# Patient Record
Sex: Female | Born: 1975 | Race: White | Hispanic: No | Marital: Married | Smoking: Former smoker
Health system: Southern US, Community
[De-identification: ages and names within clinical notes are randomized; demographics above are authoritative.]

## PROBLEM LIST (undated history)

## (undated) DIAGNOSIS — C801 Malignant (primary) neoplasm, unspecified: Secondary | ICD-10-CM

## (undated) DIAGNOSIS — K623 Rectal prolapse: Secondary | ICD-10-CM

## (undated) DIAGNOSIS — R519 Headache, unspecified: Secondary | ICD-10-CM

## (undated) DIAGNOSIS — Z9889 Other specified postprocedural states: Secondary | ICD-10-CM

## (undated) DIAGNOSIS — K5900 Constipation, unspecified: Secondary | ICD-10-CM

## (undated) DIAGNOSIS — J189 Pneumonia, unspecified organism: Secondary | ICD-10-CM

## (undated) DIAGNOSIS — R112 Nausea with vomiting, unspecified: Secondary | ICD-10-CM

## (undated) DIAGNOSIS — T7840XA Allergy, unspecified, initial encounter: Secondary | ICD-10-CM

## (undated) DIAGNOSIS — G709 Myoneural disorder, unspecified: Secondary | ICD-10-CM

## (undated) DIAGNOSIS — J302 Other seasonal allergic rhinitis: Secondary | ICD-10-CM

## (undated) DIAGNOSIS — R51 Headache: Secondary | ICD-10-CM

## (undated) HISTORY — PX: SIGMOIDOSCOPY: SUR1295

## (undated) HISTORY — DX: Malignant (primary) neoplasm, unspecified: C80.1

## (undated) HISTORY — DX: Other specified postprocedural states: R11.2

## (undated) HISTORY — DX: Myoneural disorder, unspecified: G70.9

## (undated) HISTORY — PX: POLYPECTOMY: SHX149

## (undated) HISTORY — DX: Other specified postprocedural states: Z98.890

## (undated) HISTORY — DX: Rectal prolapse: K62.3

## (undated) HISTORY — PX: COLONOSCOPY: SHX174

## (undated) HISTORY — DX: Allergy, unspecified, initial encounter: T78.40XA

## (undated) HISTORY — DX: Constipation, unspecified: K59.00

---

## 2012-06-23 DIAGNOSIS — J309 Allergic rhinitis, unspecified: Secondary | ICD-10-CM | POA: Diagnosis present

## 2012-06-23 DIAGNOSIS — K589 Irritable bowel syndrome without diarrhea: Secondary | ICD-10-CM | POA: Insufficient documentation

## 2013-06-02 DIAGNOSIS — K623 Rectal prolapse: Secondary | ICD-10-CM

## 2013-06-02 HISTORY — DX: Rectal prolapse: K62.3

## 2013-09-21 ENCOUNTER — Ambulatory Visit (INDEPENDENT_AMBULATORY_CARE_PROVIDER_SITE_OTHER): Payer: Self-pay | Admitting: Surgery

## 2013-10-07 ENCOUNTER — Ambulatory Visit (INDEPENDENT_AMBULATORY_CARE_PROVIDER_SITE_OTHER): Payer: Self-pay | Admitting: Surgery

## 2016-02-27 ENCOUNTER — Other Ambulatory Visit: Payer: Self-pay | Admitting: Surgery

## 2016-02-27 NOTE — H&P (Signed)
Susan Meyers 02/27/2016 1:41 PM Location: [facility] Patient #: [id number] DOB: 14-Sep-1976 Widowed / Language: Susan Meyers / Race: Undefined Female  History of Present Illness [name]; 02/27/2016 2:41 PM) The patient is a 40 year old female who presents with rectal prolapse. Note for "Rectal prolapse": Pleasant woman originally from [name]. Relocated to [name] 2012. Noted since her last pregnancy worsening prolapse of tissue out her rectum. Has progressively worsened. She notes several inches can fallout when she moves her bowels. She has to push it back in. No history of hemorrhoid problems. She already has a bowel movement every day. No bad history of constipation or diarrhea in the past. Her maternal grandmother did have colon cancer diagnosed in her 38. Therefore, the patient had colonoscopy a few years ago. She recalls a polyp removed that was not precancerous (? Hyperplastic). That was in 2012. No incontinence to flatus or stool. Often has drainage and mucus now. She is evaluated 2014. [place] in rectal prolapse my colorectal surgeon in [place]. Surgery offered. She is in between jobs held off. However things have progressed. She is in a better situations in which to deal with now.  Therefore her primary care physician, Dr. [name], requested surgical consultation.  No personal nor family history of inflammatory bowel disease, allergy such as Celiac Sprue, dietary/dairy problems, colitis, ulcers nor gastritis. No recent sick contacts/gastroenteritis. No travel outside the country. No changes in diet. No dysphagia to solids or liquids. No significant heartburn or reflux. No hematochezia, hematemesis, coffee ground emesis. No evidence of prior gastric/peptic ulceration. She recalls being told she had irritable bowel syndrome but denies any episodes of intermittent constipation nor diarrhea.   Other Problems [name], CMA;  02/27/2016 1:42 PM) Hemorrhoids  Past Surgical History [name], CMA; 02/27/2016 1:42 PM) Colon Polyp Removal - Open  Diagnostic Studies History [name], CMA; 02/27/2016 1:42 PM) Colonoscopy never Mammogram within last year  Allergies [name], CMA; 02/27/2016 1:43 PM) No Known Drug Allergies 02/27/2016  Medication History ([name], CMA; 02/27/2016 1:43 PM) ValACYclovir HCl (500MG  Tablet, Oral as needed) Active. Medications Reconciled  Pregnancy / Birth History [name], CMA; 02/27/2016 1:42 PM) Age at menarche 14 years. Contraceptive History Intrauterine device. Gravida 4 Irregular periods Maternal age 16-20 Para 2     Review of Systems ([place]; 02/27/2016 1:42 PM) General Present- Fatigue. Not Present- Appetite Loss, Chills, Fever, Night Sweats, Weight Gain and Weight Loss. HEENT Present- Sinus Pain and Wears glasses/contact lenses. Not Present- Earache, Hearing Loss, Hoarseness, Nose Bleed, Oral Ulcers, Ringing in the Ears, Seasonal Allergies, Sore Throat, Visual Disturbances and Yellow Eyes. Gastrointestinal Present- Bloody Stool. Not Present- Abdominal Pain, Bloating, Change in Bowel Habits, Chronic diarrhea, Constipation, Difficulty Swallowing, Excessive gas, Gets full quickly at meals, Hemorrhoids, Indigestion, Nausea, Rectal Pain and Vomiting. Neurological Present- Headaches. Not Present- Decreased Memory, Fainting, Numbness, Seizures, Tingling, Tremor, Trouble walking and Weakness. Psychiatric Not Present- Anxiety, Bipolar, Change in Sleep Pattern, Depression, Fearful and Frequent crying. Endocrine Not Present- Cold Intolerance, Excessive Hunger, Hair Changes, Heat Intolerance, Hot flashes and New Diabetes. Hematology Not Present- Easy Bruising, Excessive bleeding, Gland problems, HIV and Persistent Infections.  Vitals ([name] CMA; 02/27/2016 1:42 PM) 02/27/2016 1:42 PM Weight: 135 lb Height: 64in Body Surface Area: 1.66 m Body  Mass Index: 23.17 kg/m  Temp.: 98.23F(Temporal)  Pulse: 81 (Regular)  BP: 124/76 (Sitting, Left Arm, Standard)      Physical Exam [name]; 02/27/2016 2:42 PM)  General Mental Status-Alert. General Appearance-Not in acute distress, Not Sickly. Orientation-Oriented X3. Hydration-Well hydrated. Voice-Normal. Note: Pleasant, chatty  Integumentary Global Assessment Upon inspection and palpation of skin surfaces of the - Axillae: non-tender, no inflammation or ulceration, no drainage. and Distribution of scalp and body hair is normal. General Characteristics Temperature - normal warmth is noted.  Head and Neck Head-normocephalic, atraumatic with no lesions or palpable masses. Face Global Assessment - atraumatic, no absence of expression. Neck Global Assessment - no abnormal movements, no bruit auscultated on the right, no bruit auscultated on the left, no decreased range of motion, non-tender. Trachea-midline. Thyroid Gland Characteristics - non-tender.  Eye Eyeball - Left-Extraocular movements intact, No Nystagmus. Eyeball - Right-Extraocular movements intact, No Nystagmus. Cornea - Left-No Hazy. Cornea - Right-No Hazy. Sclera/Conjunctiva - Left-No scleral icterus, No Discharge. Sclera/Conjunctiva - Right-No scleral icterus, No Discharge. Pupil - Left-Direct reaction to light normal. Pupil - Right-Direct reaction to light normal.  ENMT Ears Pinna - Left - no drainage observed, no generalized tenderness observed. Right - no drainage observed, no generalized tenderness observed. Nose and Sinuses External Inspection of the Nose - no destructive lesion observed. Inspection of the nares - Left - quiet respiration. Right - quiet respiration. Mouth and Throat Lips - Upper Lip - no fissures observed, no pallor noted. Lower Lip - no fissures observed, no pallor noted. Nasopharynx - no discharge present. Oral Cavity/Oropharynx -  Tongue - no dryness observed. Oral Mucosa - no cyanosis observed. Hypopharynx - no evidence of airway distress observed.  Chest and Lung Exam Inspection Movements - Normal and Symmetrical. Accessory muscles - No use of accessory muscles in breathing. Palpation Palpation of the chest reveals - Non-tender. Auscultation Breath sounds - Normal and Clear.  Cardiovascular Auscultation Rhythm - Regular. Murmurs & Other Heart Sounds - Auscultation of the heart reveals - No Murmurs and No Systolic Clicks.  Abdomen Inspection Inspection of the abdomen reveals - No Visible peristalsis and No Abnormal pulsations. Umbilicus - No Bleeding, No Urine drainage. Palpation/Percussion Palpation and Percussion of the abdomen reveal - Soft, Non Tender, No Rebound tenderness, No Rigidity (guarding) and No Cutaneous hyperesthesia.  Female Genitourinary Sexual Maturity Tanner 5 - Adult hair pattern. Note: Labia normal. No Bartholin's cyst inflammation. No rectocele. No cystocele. No vaginal bleeding nor discharge  Rectal Note: Examination done was female assistant present. Perianal skin clear. Mildly enlarged midline raphae tissue. Slightly reduced but intact sphincter tone. Redundant rectum on palpation. Not consistent with hemorrhoids. Circumferential , especially posteriorly. Irritation consistent with prolapse. No rectocele. No cystocele. On anoscopy moderate volume of rectal tissue easily prolapses out. Not full proximal a dentia. From another colorectal surgeon the year before was able to reproduce prolapse with her sitting on the toilet & straining for 10 minutes. Given that, did not feel needed to repeat.  Peripheral Vascular Upper Extremity Inspection - Left - No Cyanotic nailbeds, Not Ischemic. Right - No Cyanotic nailbeds, Not Ischemic.  Neurologic Neurologic evaluation reveals -normal attention span and ability to concentrate, able to name objects and repeat phrases. Appropriate  fund of knowledge , normal sensation and normal coordination. Mental Status Affect - not angry, not paranoid. Cranial Nerves-Normal Bilaterally. Gait-Normal.  Neuropsychiatric Mental status exam performed with findings of-able to articulate well with normal speech/language, rate, volume and coherence, thought content normal with ability to perform basic computations and apply abstract reasoning and no evidence of hallucinations, delusions, obsessions or homicidal/suicidal ideation.  Musculoskeletal Global Assessment Spine, Ribs and Pelvis - no instability, subluxation or  laxity. Right Upper Extremity - no instability, subluxation or laxity.  Lymphatic Head & Neck  General Head & Neck Lymphatics: Bilateral - Description - No Localized lymphadenopathy. Axillary  General Axillary Region: Bilateral - Description - No Localized lymphadenopathy. Femoral & Inguinal  Generalized Femoral & Inguinal Lymphatics: Left - Description - No Localized lymphadenopathy. Right - Description - No Localized lymphadenopathy.    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 Not consistent with simply hemorrhoid.  This will require more definitive surgery to treat. Given her young age, would do resection rectopexy. Good candidate for a robotic/laparoscopic approach. She is anxious to proceed.  I did discuss with her the need for follow-up colonoscopy given her history of rectal polyps. However, she feels almost certain that this was a non-precancerous, hyperplastic polyp. Therefore colonoscopy in 10 years is reasonable (2021) she is going to check on the pathology report from his real. If it is adenomatous, I would recommend colonoscopy. If hyperplastic. Can wait. She would like to hold off.  Current Plans Pt Education - [facility] Pelvic Floor  Exercises (Kegels) and Dysfunction HCI (Tiauna Whisnant) ENCOUNTER FOR PREOPERATIVE EXAMINATION FOR GENERAL SURGICAL PROCEDURE (Z01.818)  Current Plans You are being scheduled for surgery - Our schedulers will call you.  You should hear from our office's scheduling department within 5 working days about the location, date, and time of surgery. We try to make accommodations for patient's preferences in scheduling surgery, but sometimes the OR schedule or the surgeon's schedule prevents [place] from making those accommodations.  If you have not heard from our office [phone]) in 5 working days, call the office and ask for your surgeon's nurse.  If you have other questions about your diagnosis, plan, or surgery, call the office and ask for your surgeon's nurse.  Written instructions provided Pt Education - [facility] Colon Bowel Prep 2015 Miralax/Antibiotics Started Neomycin Sulfate 500MG , 2 (two) Tablet SEE NOTE, #6, 02/27/2016, No Refill. Local Order: TAKE TWO TABLETS AT 2 PM, 3 PM, AND 10 PM THE DAY PRIOR TO SURGERY Started Flagyl 500MG , 2 (two) Tablet SEE NOTE, #6, 02/27/2016, No Refill. Local Order: Take at 2pm, 3pm, and 10pm the day prior to your colon operation Pt Education - [facility] Colectomy post-op instructions: discussed with patient and provided information. Pt Education - Pamphlet Given - Laparoscopic Colorectal Surgery: discussed with patient and provided information. Pt Education - [facility] Good Bowel Health (Eliah Ozawa) Pt Education - [facility] Pain Control (Vasilios Ottaway)  Adin Hector, M.D., F.A.C.S. Gastrointestinal and Minimally Invasive Surgery [facility] [address], [place], [place] [address] [phone] Main / Paging

## 2016-05-29 ENCOUNTER — Other Ambulatory Visit: Payer: Self-pay | Admitting: Surgery

## 2016-05-29 NOTE — Patient Instructions (Addendum)
Jacquelynne A Chong  05/29/2016   Your procedure is scheduled on: 06/06/2016    Report to [facility]  Entrance take [facility]  elevators to 3rd floor to  [place] at  1000 AM.  Call this number if you have problems the morning of surgery [phone]   Remember: ONLY 1 PERSON MAY GO WITH YOU TO SHORT STAY TO GET  READY MORNING OF [place].  Do not eat food or drink liquids :After Midnight.             Follow Bowel Prep Instructions per MD      Take these medicines the morning of surgery with A SIP OF WATER: Zyrtec, Ventolin Inhaler if needed and bring                                You may not have any metal on your body including hair pins and              piercings  Do not wear jewelry, make-up, lotions, powders or perfumes, deodorant             Do not wear nail polish.  Do not shave  48 hours prior to surgery.                Do not bring valuables to the hospital. [place].  Contacts, dentures or bridgework may not be worn into surgery.  Leave suitcase in the car. After surgery it may be brought to your room.     CLEAR LIQUID DIET   Foods Allowed                                                                     Foods Excluded  Coffee and tea, regular and decaf                             liquids that you cannot  Plain Jell-O in any flavor                                             see through such as: Fruit ices (not with fruit pulp)                                     milk, soups, orange juice  Iced Popsicles                                    All solid food Carbonated beverages, regular and diet  Cranberry, grape and apple juices Sports drinks like Gatorade Lightly seasoned clear broth or consume(fat free) Sugar, honey syrup  Sample Menu Breakfast                                Lunch                                     Supper Cranberry juice                     Beef broth                            Chicken broth Jell-O                                     Grape juice                           Apple juice Coffee or tea                        Jell-O                                      Popsicle                                                Coffee or tea                        Coffee or tea  _____________________________________________________________________                  Please read over the following fact sheets you were given: _____________________________________________________________________             [facility] - Preparing for Surgery Before surgery, you can play an important role.  Because skin is not sterile, your skin needs to be as free of germs as possible.  You can reduce the number of germs on your skin by washing with CHG (chlorahexidine gluconate) soap before surgery.  CHG is an antiseptic cleaner which kills germs and bonds with the skin to continue killing germs even after washing. Please DO NOT use if you have an allergy to CHG or antibacterial soaps.  If your skin becomes reddened/irritated stop using the CHG and inform your nurse when you arrive at Short Stay. Do not shave (including legs and underarms) for at least 48 hours prior to the first CHG shower.  You may shave your face/neck. Please follow these instructions carefully:  1.  Shower with CHG Soap the night before surgery and the  morning of Surgery.  2.  If you choose to wash your hair, wash your hair first as usual with your  normal  shampoo.  3.  After you shampoo, rinse your hair and body thoroughly to remove the  shampoo.  4.  Use CHG as you would any other liquid soap.  You can apply chg directly  to the skin and wash                       Gently with a scrungie or clean washcloth.  5.  Apply the CHG Soap to your body ONLY FROM THE NECK DOWN.   Do not use on face/ open                           Wound or open sores. Avoid  contact with eyes, ears mouth and genitals (private parts).                       Wash face,  Genitals (private parts) with your normal soap.             6.  Wash thoroughly, paying special attention to the area where your surgery  will be performed.  7.  Thoroughly rinse your body with warm water from the neck down.  8.  DO NOT shower/wash with your normal soap after using and rinsing off  the CHG Soap.                9.  Pat yourself dry with a clean towel.            10.  Wear clean pajamas.            11.  Place clean sheets on your bed the night of your first shower and do not  sleep with pets. Day of Surgery : Do not apply any lotions/deodorants the morning of surgery.  Please wear clean clothes to the hospital/surgery center.  FAILURE TO FOLLOW THESE INSTRUCTIONS MAY RESULT IN THE CANCELLATION OF YOUR SURGERY PATIENT SIGNATURE_________________________________  NURSE SIGNATURE__________________________________  ________________________________________________________________________  WHAT IS A BLOOD TRANSFUSION? Blood Transfusion Information  A transfusion is the replacement of blood or some of its parts. Blood is made up of multiple cells which provide different functions.  Red blood cells carry oxygen and are used for blood loss replacement.  White blood cells fight against infection.  Platelets control bleeding.  Plasma helps clot blood.  Other blood products are available for specialized needs, such as hemophilia or other clotting disorders. BEFORE THE TRANSFUSION  Who gives blood for transfusions?   Healthy volunteers who are fully evaluated to make sure their blood is safe. This is blood bank blood. Transfusion therapy is the safest it has ever been in the practice of medicine. Before blood is taken from a donor, a complete history is taken to make sure that person has no history of diseases nor engages in risky social behavior (examples are intravenous drug use or  sexual activity with multiple partners). The donor's travel history is screened to minimize risk of transmitting infections, such as malaria. The donated blood is tested for signs of infectious diseases, such as HIV and hepatitis. The blood is then tested to be sure it is compatible with you in order to minimize the chance of a transfusion reaction. If you or a relative donates blood, this is often done in anticipation of surgery and is not appropriate for emergency situations. It takes many days to process the donated blood. RISKS AND COMPLICATIONS Although transfusion therapy is very safe and saves many lives, the main dangers of transfusion include:   Getting an infectious disease.  Developing a transfusion reaction.  This is an allergic reaction to something in the blood you were given. Every precaution is taken to prevent this. The decision to have a blood transfusion has been considered carefully by your caregiver before blood is given. Blood is not given unless the benefits outweigh the risks. AFTER THE TRANSFUSION  Right after receiving a blood transfusion, you will usually feel much better and more energetic. This is especially true if your red blood cells have gotten low (anemic). The transfusion raises the level of the red blood cells which carry oxygen, and this usually causes an energy increase.  The nurse administering the transfusion will monitor you carefully for complications. HOME CARE INSTRUCTIONS  No special instructions are needed after a transfusion. You may find your energy is better. Speak with your caregiver about any limitations on activity for underlying diseases you may have. SEEK MEDICAL CARE IF:   Your condition is not improving after your transfusion.  You develop redness or irritation at the intravenous (IV) site. SEEK IMMEDIATE MEDICAL CARE IF:  Any of the following symptoms occur over the next 12 hours:  Shaking chills.  You have a temperature by mouth above  102 F (38.9 C), not controlled by medicine.  Chest, back, or muscle pain.  People around you feel you are not acting correctly or are confused.  Shortness of breath or difficulty breathing.  Dizziness and fainting.  You get a rash or develop hives.  You have a decrease in urine output.  Your urine turns a dark color or changes to pink, red, or brown. Any of the following symptoms occur over the next 10 days:  You have a temperature by mouth above 102 F (38.9 C), not controlled by medicine.  Shortness of breath.  Weakness after normal activity.  The white part of the eye turns yellow (jaundice).  You have a decrease in the amount of urine or are urinating less often.  Your urine turns a dark color or changes to pink, red, or brown. Document Released: 12/07/2000 Document Revised: 03/03/2012 Document Reviewed: 07/26/2008 [facility] Patient Information 2014 [place], [place].  _______________________________________________________________________

## 2016-05-29 NOTE — H&P (Signed)
Susan Meyers 02/27/2016 1:41 PM Location: [facility] Patient #: [id number] DOB: Sep 20, 1976 Widowed / Language: Susan Meyers / Race: Undefined Female  Patient Care Team: Susan Arnt, MD as PCP - General (Family Medicine) Susan Boston, MD as Consulting Physician (General Surgery)   History of Present Illness  The patient is a 40 year old female who presents with rectal prolapse.   Pleasant woman originally from [name]. Relocated to [name] 2012. Noted since her last pregnancy worsening prolapse of tissue out her rectum. Has progressively worsened. She notes several inches can fallout when she moves her bowels. She has to push it back in. No history of hemorrhoid problems. She already has a bowel movement every day. No bad history of constipation or diarrhea in the past. Her maternal grandmother did have colon cancer diagnosed in her 2. Therefore, the patient had colonoscopy a few years ago. She recalls a polyp removed that was not precancerous (? Hyperplastic). That was in 2012. No incontinence to flatus or stool. Often has drainage and mucus now. She is evaluated 2014. Susan Meyers in rectal prolapse my colorectal surgeon in [place]. Surgery offered. She is in between jobs held off. However things have progressed. She is in a better situations in which to deal with now.  Therefore her primary care physician, Susan Meyers, requested surgical consultation.  No personal nor family history of inflammatory bowel disease, allergy such as Celiac Sprue, dietary/dairy problems, colitis, ulcers nor gastritis. No recent sick contacts/gastroenteritis. No travel outside the country. No changes in diet. No dysphagia to solids or liquids. No significant heartburn or reflux. No hematochezia, hematemesis, coffee ground emesis. No evidence of prior gastric/peptic ulceration. She recalls being told she had irritable bowel syndrome but denies any episodes of  intermittent constipation nor diarrhea.   Other Problems Susan Meyers, CMA; 02/27/2016 1:42 PM) Hemorrhoids  Past Surgical History Susan Meyers, CMA; 02/27/2016 1:42 PM) Colon Polyp Removal - Open  Diagnostic Studies History Susan Meyers, CMA; 02/27/2016 1:42 PM) Colonoscopy never Mammogram within last year  Allergies Susan Meyers, CMA; 02/27/2016 1:43 PM) No Known Drug Allergies03/05/2016  Medication History (Susan Meyers, CMA; 02/27/2016 1:43 PM) ValACYclovir HCl (500MG  Tablet, Oral as needed) Active. Medications Reconciled  Pregnancy / Birth History Susan Meyers, CMA; 02/27/2016 1:42 PM) Age at menarche 34 years. Contraceptive History Intrauterine device. Gravida 4 Irregular periods Maternal age 23-20 Para 2    Review of Systems ([name]; 02/27/2016 1:42 PM) General Present- Fatigue. Not Present- Appetite Loss, Chills, Fever, Night Sweats, Weight Gain and Weight Loss. HEENT Present- Sinus Pain and Wears glasses/contact lenses. Not Present- Earache, Hearing Loss, Hoarseness, Nose Bleed, Oral Ulcers, Ringing in the Ears, Seasonal Allergies, Sore Throat, Visual Disturbances and Yellow Eyes. Gastrointestinal Present- Bloody Stool. Not Present- Abdominal Pain, Bloating, Change in Bowel Habits, Chronic diarrhea, Constipation, Difficulty Swallowing, Excessive gas, Gets full quickly at meals, Hemorrhoids, Indigestion, Nausea, Rectal Pain and Vomiting. Neurological Present- Headaches. Not Present- Decreased Memory, Fainting, Numbness, Seizures, Tingling, Tremor, Trouble walking and Weakness. Psychiatric Not Present- Anxiety, Bipolar, Change in Sleep Pattern, Depression, Fearful and Frequent crying. Endocrine Not Present- Cold Intolerance, Excessive Hunger, Hair Changes, Heat Intolerance, Hot flashes and New Diabetes. Hematology Not Present- Easy Bruising, Excessive bleeding, Gland problems, HIV and Persistent Infections.  Vitals (Susan Meyers CMA; 02/27/2016 1:42 PM) 02/27/2016  1:42 PM Weight: 135 lb Height: 64in Body Surface Area: 1.66 m Body Mass Index: 23.17 kg/m  Temp.: 98.50F(Temporal)  Pulse: 81 (Regular)  BP: 124/76 (Sitting, Left Arm, Standard)  Physical Exam Susan Hector MD; 02/27/2016 2:42 PM) General Mental Status-Alert. General Appearance-Not in acute distress, Not Sickly. Orientation-Oriented X3. Hydration-Well hydrated. Voice-Normal. Note: Pleasant, chatty   Integumentary Global Assessment Upon inspection and palpation of skin surfaces of the - Axillae: non-tender, no inflammation or ulceration, no drainage. and Distribution of scalp and body hair is normal. General Characteristics Temperature - normal warmth is noted.  Head and Neck Head-normocephalic, atraumatic with no lesions or palpable masses. Face Global Assessment - atraumatic, no absence of expression. Neck Global Assessment - no abnormal movements, no bruit auscultated on the right, no bruit auscultated on the left, no decreased range of motion, non-tender. Trachea-midline. Thyroid Gland Characteristics - non-tender.  Eye Eyeball - Left-Extraocular movements intact, No Nystagmus. Eyeball - Right-Extraocular movements intact, No Nystagmus. Cornea - Left-No Hazy. Cornea - Right-No Hazy. Sclera/Conjunctiva - Left-No scleral icterus, No Discharge. Sclera/Conjunctiva - Right-No scleral icterus, No Discharge. Pupil - Left-Direct reaction to light normal. Pupil - Right-Direct reaction to light normal.  ENMT Ears Pinna - Left - no drainage observed, no generalized tenderness observed. Right - no drainage observed, no generalized tenderness observed. Nose and Sinuses External Inspection of the Nose - no destructive lesion observed. Inspection of the nares - Left - quiet respiration. Right - quiet respiration. Mouth and Throat Lips - Upper Lip - no fissures observed, no pallor noted. Lower Lip - no fissures observed, no  pallor noted. Nasopharynx - no discharge present. Oral Cavity/Oropharynx - Tongue - no dryness observed. Oral Mucosa - no cyanosis observed. Hypopharynx - no evidence of airway distress observed.  Chest and Lung Exam Inspection Movements - Normal and Symmetrical. Accessory muscles - No use of accessory muscles in breathing. Palpation Palpation of the chest reveals - Non-tender. Auscultation Breath sounds - Normal and Clear.  Cardiovascular Auscultation Rhythm - Regular. Murmurs & Other Heart Sounds - Auscultation of the heart reveals - No Murmurs and No Systolic Clicks.  Abdomen Inspection Inspection of the abdomen reveals - No Visible peristalsis and No Abnormal pulsations. Umbilicus - No Bleeding, No Urine drainage. Palpation/Percussion Palpation and Percussion of the abdomen reveal - Soft, Non Tender, No Rebound tenderness, No Rigidity (guarding) and No Cutaneous hyperesthesia.  Female Genitourinary Sexual Maturity Tanner 5 - Adult hair pattern. Note: Labia normal. No Bartholin's cyst inflammation. No rectocele. No cystocele. No vaginal bleeding nor discharge   Rectal Note: Examination done was female assistant present. Perianal skin clear. Mildly enlarged midline raphae tissue. Slightly reduced but intact sphincter tone. Redundant rectum on palpation. Not consistent with hemorrhoids. Circumferential , especially posteriorly. Irritation consistent with prolapse. No rectocele. No cystocele. On anoscopy moderate volume of rectal tissue easily prolapses out. From another colorectal surgeon the year before was able to reproduce prolapse with her sitting on the toilet & straining for 10 minutes. Given that, did not feel needed to repeat.   Peripheral Vascular Upper Extremity Inspection - Left - No Cyanotic nailbeds, Not Ischemic. Right - No Cyanotic nailbeds, Not Ischemic.  Neurologic Neurologic evaluation reveals -normal attention span and ability to concentrate, able  to name objects and repeat phrases. Appropriate fund of knowledge , normal sensation and normal coordination. Mental Status Affect - not angry, not paranoid. Cranial Nerves-Normal Bilaterally. Gait-Normal.  Neuropsychiatric Mental status exam performed with findings of-able to articulate well with normal speech/language, rate, volume and coherence, thought content normal with ability to perform basic computations and apply abstract reasoning and no evidence of hallucinations, delusions, obsessions or homicidal/suicidal ideation.  Musculoskeletal Global Assessment Spine, Ribs  and Pelvis - no instability, subluxation or laxity. Right Upper Extremity - no instability, subluxation or laxity.  Lymphatic Head & Neck  General Head & Neck Lymphatics: Bilateral - Description - No Localized lymphadenopathy. Axillary  General Axillary Region: Bilateral - Description - No Localized lymphadenopathy. Femoral & Inguinal  Generalized Femoral & Inguinal Lymphatics: Left - Description - No Localized lymphadenopathy. Right - Description - No Localized lymphadenopathy.    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 Not consistent with simply hemorrhoid.  This will require more definitive surgery to treat. Given her young age, would do resection & rectopexy. Good candidate for a robotic/laparoscopic approach. She is anxious to proceed.  I did discuss with her the need for follow-up colonoscopy given her history of rectal polyps. However, she feels almost certain that this was a non-precancerous, hyperplastic polyp. Therefore colonoscopy in 10 years is reasonable (2021) she is going to check on the pathology report from his real. If it is adenomatous, I would recommend colonoscopy. If hyperplastic. Can wait. She would like to hold off. Current Plans Pt Education - CCS  Pelvic Floor Exercises (Kegels) and Dysfunction HCI ([place])  ENCOUNTER FOR PREOPERATIVE EXAMINATION FOR GENERAL SURGICAL PROCEDURE (Z01.818) Current Plans You are being scheduled for surgery - Our schedulers will call you.  You should hear from our office's scheduling department within 5 working days about the location, date, and time of surgery. We try to make accommodations for patient's preferences in scheduling surgery, but sometimes the OR schedule or the surgeon's schedule prevents [place] from making those accommodations.  If you have not heard from our office [phone]) in 5 working days, call the office and ask for your surgeon's nurse.  If you have other questions about your diagnosis, plan, or surgery, call the office and ask for your surgeon's nurse.  Written instructions provided Pt Education - CCS Colon Bowel Prep 2015 Miralax/Antibiotics Started Neomycin Sulfate 500MG , 2 (two) Tablet SEE NOTE, #6, 02/27/2016, No Refill. Local Order: TAKE TWO TABLETS AT 2 PM, 3 PM, AND 10 PM THE DAY PRIOR TO SURGERY Started Flagyl 500MG , 2 (two) Tablet SEE NOTE, #6, 02/27/2016, No Refill. Local Order: Take at 2pm, 3pm, and 10pm the day prior to your colon operation Pt Education - CCS Colectomy post-op instructions: discussed with patient and provided information. Pt Education - Pamphlet Given - Laparoscopic Colorectal Surgery: discussed with patient and provided information. Pt Education - CCS Good Bowel Health (Cyril [place]) Pt Education - CCS Pain Control (Osamu Olguin)   Signed by Susan Hector, MD   Susan Meyers, M.D., F.A.C.S. Gastrointestinal and Minimally Invasive Surgery [facility] [address], [place], [place] [address] [phone] Main / Paging

## 2016-06-01 ENCOUNTER — Encounter (HOSPITAL_COMMUNITY): Payer: Self-pay

## 2016-06-01 ENCOUNTER — Encounter (HOSPITAL_COMMUNITY)
Admission: RE | Admit: 2016-06-01 | Discharge: 2016-06-01 | Disposition: A | Discharge status: Home / Self Care | Payer: BLUE CROSS/BLUE SHIELD | Source: Ambulatory Visit | Attending: Surgery | Admitting: Surgery

## 2016-06-01 DIAGNOSIS — Z01812 Encounter for preprocedural laboratory examination: Secondary | ICD-10-CM | POA: Insufficient documentation

## 2016-06-01 DIAGNOSIS — K623 Rectal prolapse: Secondary | ICD-10-CM | POA: Insufficient documentation

## 2016-06-01 DIAGNOSIS — Z0183 Encounter for blood typing: Secondary | ICD-10-CM | POA: Diagnosis not present

## 2016-06-01 HISTORY — DX: Pneumonia, unspecified organism: J18.9

## 2016-06-01 HISTORY — DX: Other seasonal allergic rhinitis: J30.2

## 2016-06-01 HISTORY — DX: Headache: R51

## 2016-06-01 HISTORY — DX: Headache, unspecified: R51.9

## 2016-06-01 LAB — CBC
HEMATOCRIT: 39.6 % (ref 36.0–46.0)
Hemoglobin: 13.4 g/dL (ref 12.0–15.0)
MCH: 31.6 pg (ref 26.0–34.0)
MCHC: 33.8 g/dL (ref 30.0–36.0)
MCV: 93.4 fL (ref 78.0–100.0)
PLATELETS: 327 10*3/uL (ref 150–400)
RBC: 4.24 MIL/uL (ref 3.87–5.11)
RDW: 13 % (ref 11.5–15.5)
WBC: 7.6 10*3/uL (ref 4.0–10.5)

## 2016-06-01 LAB — ABO/RH: ABO/RH(D): B NEG

## 2016-06-01 NOTE — Pre-Procedure Instructions (Signed)
Pt received PST instructions.   Pt stated she did not receive bowel prep instructions from [facility].  Spoke with Abigail Butts at [facility] on the phone. She faxed Bowel Prep instructions.  I gave them to pt and she reviewed them in office.

## 2016-06-02 LAB — HEMOGLOBIN A1C
Hgb A1c MFr Bld: 4.9 % (ref 4.8–5.6)
MEAN PLASMA GLUCOSE: 94 mg/dL

## 2016-06-05 MED ORDER — SODIUM CHLORIDE 0.9 % IV SOLN
INTRAVENOUS | Status: DC
  Filled 2016-06-05: qty 6

## 2016-06-06 ENCOUNTER — Inpatient Hospital Stay (HOSPITAL_COMMUNITY): Payer: BLUE CROSS/BLUE SHIELD | Admitting: Certified Registered Nurse Anesthetist

## 2016-06-06 ENCOUNTER — Inpatient Hospital Stay (HOSPITAL_COMMUNITY)
Admission: RE | Admit: 2016-06-06 | Discharge: 2016-06-09 | DRG: 330 | Disposition: A | Discharge status: Home / Self Care | Payer: BLUE CROSS/BLUE SHIELD | Source: Ambulatory Visit | Attending: Surgery | Admitting: Surgery

## 2016-06-06 ENCOUNTER — Encounter (HOSPITAL_COMMUNITY): Payer: Self-pay | Admitting: Certified Registered Nurse Anesthetist

## 2016-06-06 ENCOUNTER — Encounter (HOSPITAL_COMMUNITY)
Admission: RE | Disposition: A | Discharge status: Home / Self Care | Payer: Self-pay | Source: Ambulatory Visit | Attending: Surgery

## 2016-06-06 DIAGNOSIS — Z87891 Personal history of nicotine dependence: Secondary | ICD-10-CM | POA: Diagnosis not present

## 2016-06-06 DIAGNOSIS — Z800 Family history of malignant neoplasm of digestive organs: Secondary | ICD-10-CM | POA: Diagnosis not present

## 2016-06-06 DIAGNOSIS — K623 Rectal prolapse: Secondary | ICD-10-CM | POA: Diagnosis present

## 2016-06-06 DIAGNOSIS — Q438 Other specified congenital malformations of intestine: Secondary | ICD-10-CM

## 2016-06-06 DIAGNOSIS — J309 Allergic rhinitis, unspecified: Secondary | ICD-10-CM | POA: Diagnosis present

## 2016-06-06 HISTORY — PX: PROCTOSCOPY: SHX2266

## 2016-06-06 HISTORY — DX: Rectal prolapse: K62.3

## 2016-06-06 LAB — TYPE AND SCREEN
ABO/RH(D): B NEG
Antibody Screen: NEGATIVE

## 2016-06-06 LAB — PREGNANCY, URINE: PREG TEST UR: NEGATIVE

## 2016-06-06 SURGERY — COLECTOMY, PARTIAL, ROBOT-ASSISTED, LAPAROSCOPIC
Anesthesia: General | Site: Rectum

## 2016-06-06 MED ORDER — BUPIVACAINE-EPINEPHRINE 0.25% -1:200000 IJ SOLN
INTRAMUSCULAR | Status: AC
  Filled 2016-06-06: qty 1

## 2016-06-06 MED ORDER — DEXAMETHASONE SODIUM PHOSPHATE 10 MG/ML IJ SOLN
INTRAMUSCULAR | Status: DC | PRN
  Administered 2016-06-06: 10 mg via INTRAVENOUS

## 2016-06-06 MED ORDER — HYDROMORPHONE HCL 1 MG/ML IJ SOLN
0.5000 mg | INTRAMUSCULAR | Status: DC | PRN
  Administered 2016-06-08: 1 mg via INTRAVENOUS
  Filled 2016-06-06: qty 1

## 2016-06-06 MED ORDER — METOPROLOL TARTRATE 5 MG/5ML IV SOLN: 5.0000 mg | Freq: Four times a day (QID) | INTRAVENOUS | Status: DC | PRN

## 2016-06-06 MED ORDER — SCOPOLAMINE 1 MG/3DAYS TD PT72
MEDICATED_PATCH | TRANSDERMAL | Status: DC | PRN
  Administered 2016-06-06: 1 via TRANSDERMAL

## 2016-06-06 MED ORDER — LACTATED RINGERS IV SOLN
INTRAVENOUS | Status: DC
  Administered 2016-06-06 – 2016-06-07 (×2): via INTRAVENOUS

## 2016-06-06 MED ORDER — LACTATED RINGERS IV SOLN
INTRAVENOUS | Status: DC | PRN
  Administered 2016-06-06 (×3): via INTRAVENOUS

## 2016-06-06 MED ORDER — MENTHOL 3 MG MT LOZG: 1.0000 | LOZENGE | OROMUCOSAL | Status: DC | PRN

## 2016-06-06 MED ORDER — ALVIMOPAN 12 MG PO CAPS
12.0000 mg | ORAL_CAPSULE | Freq: Two times a day (BID) | ORAL | Status: DC
  Administered 2016-06-07 (×2): 12 mg via ORAL
  Filled 2016-06-06 (×4): qty 1

## 2016-06-06 MED ORDER — ONDANSETRON HCL 4 MG/2ML IJ SOLN
INTRAMUSCULAR | Status: AC
  Filled 2016-06-06: qty 2

## 2016-06-06 MED ORDER — FENTANYL CITRATE (PF) 100 MCG/2ML IJ SOLN
INTRAMUSCULAR | Status: DC | PRN
  Administered 2016-06-06 (×4): 50 ug via INTRAVENOUS
  Administered 2016-06-06: 100 ug via INTRAVENOUS
  Administered 2016-06-06: 50 ug via INTRAVENOUS

## 2016-06-06 MED ORDER — MIDAZOLAM HCL 2 MG/2ML IJ SOLN
INTRAMUSCULAR | Status: AC
  Filled 2016-06-06: qty 2

## 2016-06-06 MED ORDER — BUPIVACAINE LIPOSOME 1.3 % IJ SUSP
20.0000 mL | INTRAMUSCULAR | Status: DC
  Filled 2016-06-06: qty 20

## 2016-06-06 MED ORDER — OXYCODONE HCL 5 MG PO TABS: 5.0000 mg | ORAL_TABLET | Freq: Four times a day (QID) | ORAL | Status: DC | PRN

## 2016-06-06 MED ORDER — ROCURONIUM BROMIDE 100 MG/10ML IV SOLN
INTRAVENOUS | Status: DC | PRN
  Administered 2016-06-06: 20 mg via INTRAVENOUS
  Administered 2016-06-06: 50 mg via INTRAVENOUS
  Administered 2016-06-06: 20 mg via INTRAVENOUS
  Administered 2016-06-06 (×3): 10 mg via INTRAVENOUS

## 2016-06-06 MED ORDER — PROPOFOL 10 MG/ML IV BOLUS
INTRAVENOUS | Status: DC | PRN
  Administered 2016-06-06: 120 mg via INTRAVENOUS

## 2016-06-06 MED ORDER — BUPIVACAINE LIPOSOME 1.3 % IJ SUSP
INTRAMUSCULAR | Status: DC | PRN
  Administered 2016-06-06: 20 mL

## 2016-06-06 MED ORDER — VITAMIN C 500 MG PO TABS
500.0000 mg | ORAL_TABLET | Freq: Two times a day (BID) | ORAL | Status: DC
Start: 2016-06-06 — End: 2016-06-09
  Administered 2016-06-06 – 2016-06-08 (×4): 500 mg via ORAL
  Filled 2016-06-06 (×8): qty 1

## 2016-06-06 MED ORDER — DEXAMETHASONE SODIUM PHOSPHATE 10 MG/ML IJ SOLN
INTRAMUSCULAR | Status: AC
  Filled 2016-06-06: qty 1

## 2016-06-06 MED ORDER — SCOPOLAMINE 1 MG/3DAYS TD PT72
MEDICATED_PATCH | TRANSDERMAL | Status: AC
  Filled 2016-06-06: qty 1

## 2016-06-06 MED ORDER — EPHEDRINE SULFATE 50 MG/ML IJ SOLN
INTRAMUSCULAR | Status: DC | PRN
  Administered 2016-06-06: 10 mg via INTRAVENOUS

## 2016-06-06 MED ORDER — VALACYCLOVIR HCL 500 MG PO TABS
500.0000 mg | ORAL_TABLET | Freq: Every day | ORAL | Status: DC | PRN
  Filled 2016-06-06: qty 1

## 2016-06-06 MED ORDER — PROMETHAZINE HCL 25 MG/ML IJ SOLN
6.2500 mg | INTRAMUSCULAR | Status: AC | PRN
  Administered 2016-06-06 (×2): 6.25 mg via INTRAVENOUS

## 2016-06-06 MED ORDER — ROCURONIUM BROMIDE 100 MG/10ML IV SOLN
INTRAVENOUS | Status: AC
  Filled 2016-06-06: qty 1

## 2016-06-06 MED ORDER — SODIUM CHLORIDE 0.9 % IV SOLN
INTRAVENOUS | Status: DC | PRN
  Administered 2016-06-06: 13:00:00 via INTRAPERITONEAL

## 2016-06-06 MED ORDER — CEFOTETAN DISODIUM-DEXTROSE 2-2.08 GM-% IV SOLR
INTRAVENOUS | Status: AC
  Filled 2016-06-06: qty 50

## 2016-06-06 MED ORDER — ENOXAPARIN SODIUM 40 MG/0.4ML ~~LOC~~ SOLN
40.0000 mg | SUBCUTANEOUS | Status: DC
Start: 2016-06-07 — End: 2016-06-09
  Administered 2016-06-07 – 2016-06-09 (×3): 40 mg via SUBCUTANEOUS
  Filled 2016-06-06 (×4): qty 0.4

## 2016-06-06 MED ORDER — SUGAMMADEX SODIUM 200 MG/2ML IV SOLN
INTRAVENOUS | Status: AC
Start: 2016-06-06 — End: 2016-06-06
  Filled 2016-06-06: qty 2

## 2016-06-06 MED ORDER — ADULT MULTIVITAMIN W/MINERALS CH
1.0000 | ORAL_TABLET | Freq: Every day | ORAL | Status: DC
  Filled 2016-06-06 (×3): qty 1

## 2016-06-06 MED ORDER — FENTANYL CITRATE (PF) 100 MCG/2ML IJ SOLN
INTRAMUSCULAR | Status: AC
  Administered 2016-06-06: 50 ug via INTRAVENOUS
  Filled 2016-06-06: qty 2

## 2016-06-06 MED ORDER — PROMETHAZINE HCL 25 MG/ML IJ SOLN
INTRAMUSCULAR | Status: AC
  Filled 2016-06-06: qty 1

## 2016-06-06 MED ORDER — LIDOCAINE HCL (CARDIAC) 20 MG/ML IV SOLN
INTRAVENOUS | Status: DC | PRN
  Administered 2016-06-06: 100 mg via INTRAVENOUS

## 2016-06-06 MED ORDER — CHLORHEXIDINE GLUCONATE 4 % EX LIQD: 60.0000 mL | Freq: Once | CUTANEOUS | Status: DC

## 2016-06-06 MED ORDER — GABAPENTIN 300 MG PO CAPS
300.0000 mg | ORAL_CAPSULE | ORAL | Status: AC
  Administered 2016-06-06: 300 mg via ORAL
  Filled 2016-06-06 (×2): qty 1

## 2016-06-06 MED ORDER — PHENOL 1.4 % MT LIQD
2.0000 | OROMUCOSAL | Status: DC | PRN
  Filled 2016-06-06: qty 177

## 2016-06-06 MED ORDER — MIDAZOLAM HCL 5 MG/5ML IJ SOLN
INTRAMUSCULAR | Status: DC | PRN
  Administered 2016-06-06: 2 mg via INTRAVENOUS

## 2016-06-06 MED ORDER — BENZONATATE 100 MG PO CAPS: 100.0000 mg | ORAL_CAPSULE | Freq: Three times a day (TID) | ORAL | Status: DC | PRN

## 2016-06-06 MED ORDER — BUPIVACAINE-EPINEPHRINE 0.25% -1:200000 IJ SOLN
INTRAMUSCULAR | Status: DC | PRN
  Administered 2016-06-06: 50 mL

## 2016-06-06 MED ORDER — SUGAMMADEX SODIUM 200 MG/2ML IV SOLN
INTRAVENOUS | Status: DC | PRN
  Administered 2016-06-06: 200 mg via INTRAVENOUS

## 2016-06-06 MED ORDER — LIDOCAINE HCL (CARDIAC) 20 MG/ML IV SOLN
INTRAVENOUS | Status: AC
  Filled 2016-06-06: qty 5

## 2016-06-06 MED ORDER — LORATADINE 10 MG PO TABS
10.0000 mg | ORAL_TABLET | Freq: Every day | ORAL | Status: DC
  Filled 2016-06-06 (×3): qty 1

## 2016-06-06 MED ORDER — CELECOXIB 400 MG PO CAPS
400.0000 mg | ORAL_CAPSULE | ORAL | Status: AC
  Administered 2016-06-06: 400 mg via ORAL
  Filled 2016-06-06 (×2): qty 1

## 2016-06-06 MED ORDER — OXYCODONE HCL 5 MG/5ML PO SOLN
5.0000 mg | Freq: Once | ORAL | Status: DC | PRN
  Filled 2016-06-06: qty 5

## 2016-06-06 MED ORDER — LACTATED RINGERS IR SOLN
Status: DC | PRN
  Administered 2016-06-06: 1000 mL

## 2016-06-06 MED ORDER — 0.9 % SODIUM CHLORIDE (POUR BTL) OPTIME
TOPICAL | Status: DC | PRN
  Administered 2016-06-06: 2000 mL

## 2016-06-06 MED ORDER — ALUM & MAG HYDROXIDE-SIMETH 200-200-20 MG/5ML PO SUSP: 30.0000 mL | Freq: Four times a day (QID) | ORAL | Status: DC | PRN

## 2016-06-06 MED ORDER — PROPOFOL 10 MG/ML IV BOLUS
INTRAVENOUS | Status: AC
  Filled 2016-06-06: qty 20

## 2016-06-06 MED ORDER — FENTANYL CITRATE (PF) 250 MCG/5ML IJ SOLN
INTRAMUSCULAR | Status: AC
  Filled 2016-06-06: qty 5

## 2016-06-06 MED ORDER — ONDANSETRON HCL 4 MG/2ML IJ SOLN
INTRAMUSCULAR | Status: DC | PRN
  Administered 2016-06-06: 4 mg via INTRAVENOUS

## 2016-06-06 MED ORDER — MAGIC MOUTHWASH
15.0000 mL | Freq: Four times a day (QID) | ORAL | Status: DC | PRN
  Filled 2016-06-06: qty 15

## 2016-06-06 MED ORDER — SUCCINYLCHOLINE CHLORIDE 20 MG/ML IJ SOLN
INTRAMUSCULAR | Status: DC | PRN
  Administered 2016-06-06: 100 mg via INTRAVENOUS

## 2016-06-06 MED ORDER — SACCHAROMYCES BOULARDII 250 MG PO CAPS
250.0000 mg | ORAL_CAPSULE | Freq: Two times a day (BID) | ORAL | Status: DC
  Administered 2016-06-06 – 2016-06-08 (×5): 250 mg via ORAL
  Filled 2016-06-06 (×9): qty 1

## 2016-06-06 MED ORDER — OXYCODONE HCL 5 MG PO TABS: 5.0000 mg | ORAL_TABLET | Freq: Once | ORAL | Status: DC | PRN

## 2016-06-06 MED ORDER — SODIUM CHLORIDE 0.9 % IJ SOLN
INTRAMUSCULAR | Status: AC
  Filled 2016-06-06: qty 50

## 2016-06-06 MED ORDER — LACTATED RINGERS IV BOLUS (SEPSIS): 1000.0000 mL | Freq: Three times a day (TID) | INTRAVENOUS | Status: AC | PRN

## 2016-06-06 MED ORDER — FENTANYL CITRATE (PF) 100 MCG/2ML IJ SOLN
25.0000 ug | INTRAMUSCULAR | Status: DC | PRN
  Administered 2016-06-06 (×2): 50 ug via INTRAVENOUS

## 2016-06-06 MED ORDER — DIPHENHYDRAMINE HCL 12.5 MG/5ML PO ELIX: 12.5000 mg | ORAL_SOLUTION | Freq: Four times a day (QID) | ORAL | Status: DC | PRN

## 2016-06-06 MED ORDER — LIP MEDEX EX OINT
1.0000 | TOPICAL_OINTMENT | Freq: Two times a day (BID) | CUTANEOUS | Status: DC
Start: 2016-06-06 — End: 2016-06-09
  Administered 2016-06-06 – 2016-06-08 (×5): 1 via TOPICAL
  Filled 2016-06-06 (×2): qty 7

## 2016-06-06 MED ORDER — ENOXAPARIN SODIUM 40 MG/0.4ML ~~LOC~~ SOLN
40.0000 mg | Freq: Once | SUBCUTANEOUS | Status: AC
Start: 2016-06-06 — End: 2016-06-06
  Administered 2016-06-06: 40 mg via SUBCUTANEOUS
  Filled 2016-06-06: qty 0.4

## 2016-06-06 MED ORDER — DIPHENHYDRAMINE HCL 50 MG/ML IJ SOLN: 12.5000 mg | Freq: Four times a day (QID) | INTRAMUSCULAR | Status: DC | PRN

## 2016-06-06 MED ORDER — DEXTROSE 5 % IV SOLN
2.0000 g | INTRAVENOUS | Status: AC
  Administered 2016-06-06: 2 g via INTRAVENOUS

## 2016-06-06 MED ORDER — ACETAMINOPHEN 500 MG PO TABS
1000.0000 mg | ORAL_TABLET | Freq: Three times a day (TID) | ORAL | Status: DC
  Administered 2016-06-06 – 2016-06-09 (×8): 1000 mg via ORAL
  Filled 2016-06-06 (×14): qty 2

## 2016-06-06 MED ORDER — FENTANYL CITRATE (PF) 100 MCG/2ML IJ SOLN
INTRAMUSCULAR | Status: AC
  Filled 2016-06-06: qty 2

## 2016-06-06 MED ORDER — ACETAMINOPHEN 500 MG PO TABS
1000.0000 mg | ORAL_TABLET | ORAL | Status: AC
  Administered 2016-06-06: 1000 mg via ORAL
  Filled 2016-06-06: qty 2

## 2016-06-06 MED ORDER — ALVIMOPAN 12 MG PO CAPS
12.0000 mg | ORAL_CAPSULE | Freq: Once | ORAL | Status: AC
  Administered 2016-06-06: 12 mg via ORAL
  Filled 2016-06-06: qty 1

## 2016-06-06 MED ORDER — PROCHLORPERAZINE EDISYLATE 5 MG/ML IJ SOLN
10.0000 mg | Freq: Four times a day (QID) | INTRAMUSCULAR | Status: DC | PRN
  Administered 2016-06-06: 10 mg via INTRAVENOUS
  Filled 2016-06-06 (×2): qty 2

## 2016-06-06 MED ORDER — ALBUTEROL SULFATE (2.5 MG/3ML) 0.083% IN NEBU: 3.0000 mL | INHALATION_SOLUTION | RESPIRATORY_TRACT | Status: DC | PRN

## 2016-06-06 MED ORDER — ONDANSETRON HCL 4 MG/2ML IJ SOLN: INTRAMUSCULAR | Status: DC | PRN

## 2016-06-06 MED ORDER — DEXTROSE 5 % IV SOLN
2.0000 g | Freq: Two times a day (BID) | INTRAVENOUS | Status: AC
  Administered 2016-06-06: 2 g via INTRAVENOUS
  Filled 2016-06-06: qty 2

## 2016-06-06 SURGICAL SUPPLY — 99 items
APPLIER CLIP 5 13 M/L LIGAMAX5 (MISCELLANEOUS)
APPLIER CLIP ROT 10 11.4 M/L (STAPLE)
BLADE EXTENDED COATED 6.5IN (ELECTRODE) IMPLANT
BLADE SURG SZ11 CARB STEEL (BLADE) IMPLANT
CANNULA REDUC XI 12-8 STAPL (CANNULA) ×1
CANNULA REDUC XI 12-8MM STAPL (CANNULA) ×1
CANNULA REDUCER 12-8 DVNC XI (CANNULA) ×2 IMPLANT
CELLS DAT CNTRL 66122 CELL SVR (MISCELLANEOUS) IMPLANT
CHLORAPREP W/TINT 26ML (MISCELLANEOUS) IMPLANT
CLIP APPLIE 5 13 M/L LIGAMAX5 (MISCELLANEOUS) IMPLANT
CLIP APPLIE ROT 10 11.4 M/L (STAPLE) IMPLANT
CLIP LIGATING HEM O LOK PURPLE (MISCELLANEOUS) IMPLANT
CLIP LIGATING HEMO O LOK GREEN (MISCELLANEOUS) IMPLANT
COUNTER NEEDLE 20 DBL MAG RED (NEEDLE) ×4 IMPLANT
COVER MAYO STAND STRL (DRAPES) ×8 IMPLANT
COVER TIP SHEARS 8 DVNC (MISCELLANEOUS) ×2 IMPLANT
COVER TIP SHEARS 8MM DA VINCI (MISCELLANEOUS) ×2
DECANTER SPIKE VIAL GLASS SM (MISCELLANEOUS) ×4 IMPLANT
DEVICE TROCAR PUNCTURE CLOSURE (ENDOMECHANICALS) IMPLANT
DRAIN CHANNEL 19F RND (DRAIN) IMPLANT
DRAPE ARM DVNC X/XI (DISPOSABLE) ×8 IMPLANT
DRAPE COLUMN DVNC XI (DISPOSABLE) ×2 IMPLANT
DRAPE DA VINCI XI ARM (DISPOSABLE) ×8
DRAPE DA VINCI XI COLUMN (DISPOSABLE) ×2
DRAPE SURG IRRIG POUCH 19X23 (DRAPES) ×4 IMPLANT
DRSG OPSITE POSTOP 4X10 (GAUZE/BANDAGES/DRESSINGS) IMPLANT
DRSG OPSITE POSTOP 4X6 (GAUZE/BANDAGES/DRESSINGS) IMPLANT
DRSG OPSITE POSTOP 4X8 (GAUZE/BANDAGES/DRESSINGS) IMPLANT
DRSG TEGADERM 2-3/8X2-3/4 SM (GAUZE/BANDAGES/DRESSINGS) ×4 IMPLANT
DRSG TEGADERM 4X4.75 (GAUZE/BANDAGES/DRESSINGS) IMPLANT
ELECT PENCIL ROCKER SW 15FT (MISCELLANEOUS) ×8 IMPLANT
ELECT REM PT RETURN 15FT ADLT (MISCELLANEOUS) ×4 IMPLANT
ENDOLOOP SUT PDS II  0 18 (SUTURE)
ENDOLOOP SUT PDS II 0 18 (SUTURE) IMPLANT
EVACUATOR SILICONE 100CC (DRAIN) IMPLANT
GAUZE SPONGE 2X2 8PLY STRL LF (GAUZE/BANDAGES/DRESSINGS) ×2 IMPLANT
GAUZE SPONGE 4X4 12PLY STRL (GAUZE/BANDAGES/DRESSINGS) IMPLANT
GLOVE ECLIPSE 8.0 STRL XLNG CF (GLOVE) ×12 IMPLANT
GLOVE INDICATOR 8.0 STRL GRN (GLOVE) ×12 IMPLANT
GOWN STRL REUS W/TWL XL LVL3 (GOWN DISPOSABLE) ×32 IMPLANT
HOLDER FOLEY CATH W/STRAP (MISCELLANEOUS) ×4 IMPLANT
KIT PROCEDURE DA VINCI SI (MISCELLANEOUS)
KIT PROCEDURE DVNC SI (MISCELLANEOUS) IMPLANT
LEGGING LITHOTOMY PAIR STRL (DRAPES) ×4 IMPLANT
LUBRICANT JELLY K Y 4OZ (MISCELLANEOUS) ×4 IMPLANT
NEEDLE INSUFFLATION 14GA 120MM (NEEDLE) ×4 IMPLANT
PACK CARDIOVASCULAR III (CUSTOM PROCEDURE TRAY) ×4 IMPLANT
PACK COLON (CUSTOM PROCEDURE TRAY) ×4 IMPLANT
PAD POSITIONING PINK XL (MISCELLANEOUS) ×4 IMPLANT
PORT LAP GEL ALEXIS MED 5-9CM (MISCELLANEOUS) ×4 IMPLANT
RTRCTR WOUND ALEXIS 18CM MED (MISCELLANEOUS)
SCISSORS LAP 5X35 DISP (ENDOMECHANICALS) ×4 IMPLANT
SEAL CANN UNIV 5-8 DVNC XI (MISCELLANEOUS) ×6 IMPLANT
SEAL XI 5MM-8MM UNIVERSAL (MISCELLANEOUS) ×6
SEALER VESSEL DA VINCI XI (MISCELLANEOUS) ×2
SEALER VESSEL EXT DVNC XI (MISCELLANEOUS) ×2 IMPLANT
SET BI-LUMEN FLTR TB AIRSEAL (TUBING) ×4 IMPLANT
SET TUBE IRRIG SUCTION NO TIP (IRRIGATION / IRRIGATOR) ×4 IMPLANT
SLEEVE ADV FIXATION 5X100MM (TROCAR) IMPLANT
SOLUTION ELECTROLUBE (MISCELLANEOUS) ×4 IMPLANT
SPONGE GAUZE 2X2 STER 10/PKG (GAUZE/BANDAGES/DRESSINGS) ×2
STAPLER 45 BLU RELOAD XI (STAPLE) ×2 IMPLANT
STAPLER 45 BLUE RELOAD XI (STAPLE) ×2
STAPLER 45 GREEN RELOAD XI (STAPLE) ×2
STAPLER 45 GRN RELOAD XI (STAPLE) ×2 IMPLANT
STAPLER CANNULA SEAL DVNC XI (STAPLE) ×2 IMPLANT
STAPLER CANNULA SEAL XI (STAPLE) ×2
STAPLER SHEATH (SHEATH) ×2
STAPLER SHEATH ENDOWRIST DVNC (SHEATH) ×2 IMPLANT
SUT MNCRL AB 4-0 PS2 18 (SUTURE) ×4 IMPLANT
SUT PDS AB 1 CTX 36 (SUTURE) ×8 IMPLANT
SUT PDS AB 1 TP1 96 (SUTURE) IMPLANT
SUT PDS AB 2-0 CT2 27 (SUTURE) IMPLANT
SUT PROLENE 0 CT 2 (SUTURE) ×4 IMPLANT
SUT PROLENE 2 0 SH DA (SUTURE) IMPLANT
SUT SILK 2 0 (SUTURE) ×2
SUT SILK 2 0 SH CR/8 (SUTURE) ×4 IMPLANT
SUT SILK 2-0 18XBRD TIE 12 (SUTURE) ×2 IMPLANT
SUT SILK 3 0 (SUTURE) ×2
SUT SILK 3 0 SH CR/8 (SUTURE) ×4 IMPLANT
SUT SILK 3-0 18XBRD TIE 12 (SUTURE) ×2 IMPLANT
SUT V-LOC BARB 180 2/0GR6 GS22 (SUTURE) ×8
SUT VIC AB 3-0 SH 18 (SUTURE) IMPLANT
SUT VIC AB 3-0 SH 27 (SUTURE)
SUT VIC AB 3-0 SH 27XBRD (SUTURE) IMPLANT
SUT VICRYL 0 UR6 27IN ABS (SUTURE) ×4 IMPLANT
SUT VLOC 180 2-0 9IN GS21 (SUTURE) IMPLANT
SUTURE V-LC BRB 180 2/0GR6GS22 (SUTURE) ×4 IMPLANT
SYRINGE 10CC LL (SYRINGE) ×4 IMPLANT
SYS LAPSCP GELPORT 120MM (MISCELLANEOUS)
SYSTEM LAPSCP GELPORT 120MM (MISCELLANEOUS) IMPLANT
TAPE UMBILICAL COTTON 1/8X30 (MISCELLANEOUS) ×4 IMPLANT
TOWEL OR 17X26 10 PK STRL BLUE (TOWEL DISPOSABLE) ×4 IMPLANT
TOWEL OR NON WOVEN STRL DISP B (DISPOSABLE) IMPLANT
TRAY FOLEY W/METER SILVER 14FR (SET/KITS/TRAYS/PACK) ×4 IMPLANT
TRAY FOLEY W/METER SILVER 16FR (SET/KITS/TRAYS/PACK) IMPLANT
TROCAR ADV FIXATION 5X100MM (TROCAR) ×4 IMPLANT
TUBING CONNECTING 10 (TUBING) IMPLANT
TUBING CONNECTING 10' (TUBING)

## 2016-06-06 NOTE — Anesthesia Preprocedure Evaluation (Addendum)
Anesthesia Evaluation  Patient identified by MRN, date of birth, ID band Patient awake    Reviewed: Allergy & Precautions, NPO status , Patient's Chart, lab work & pertinent test results  Airway Mallampati: II  TM Distance: >3 FB Neck ROM: Full    Dental  (+) Dental Advisory Given   Pulmonary former smoker,    breath sounds clear to auscultation       Cardiovascular negative cardio ROS   Rhythm:Regular Rate:Normal     Neuro/Psych negative neurological ROS     GI/Hepatic negative GI ROS, Neg liver ROS,   Endo/Other  negative endocrine ROS  Renal/GU negative Renal ROS     Musculoskeletal negative musculoskeletal ROS (+)   Abdominal   Peds  Hematology negative hematology ROS (+)   Anesthesia Other Findings   Reproductive/Obstetrics                            Lab Results  Component Value Date   WBC 7.6 06/01/2016   HGB 13.4 06/01/2016   HCT 39.6 06/01/2016   MCV 93.4 06/01/2016   PLT 327 06/01/2016   No results found for: CREATININE, BUN, NA, K, CL, CO2  Anesthesia Physical Anesthesia Plan  ASA: I  Anesthesia Plan: General   Post-op Pain Management:    Induction: Intravenous  Airway Management Planned: Oral ETT  Additional Equipment:   Intra-op Plan:   Post-operative Plan: Extubation in OR  Informed Consent: I have reviewed the patients History and Physical, chart, labs and discussed the procedure including the risks, benefits and alternatives for the proposed anesthesia with the patient or authorized representative who has indicated his/her understanding and acceptance.   Dental advisory given  Plan Discussed with: CRNA  Anesthesia Plan Comments:         Anesthesia Quick Evaluation

## 2016-06-06 NOTE — Anesthesia Procedure Notes (Signed)
Procedure Name: Intubation Date/Time: 06/06/2016 12:22 PM Performed by: Maxwell Caul Pre-anesthesia Checklist: Patient identified, Emergency Drugs available, Suction available and Patient being monitored Patient Re-evaluated:Patient Re-evaluated prior to inductionOxygen Delivery Method: Circle system utilized Preoxygenation: Pre-oxygenation with 100% oxygen Intubation Type: IV induction Ventilation: Mask ventilation without difficulty Laryngoscope Size: Mac and 4 Grade View: Grade I Tube type: Oral Tube size: 7.0 mm Number of attempts: 1 Airway Equipment and Method: Stylet Placement Confirmation: ETT inserted through vocal cords under direct vision,  positive ETCO2 and breath sounds checked- equal and bilateral Secured at: 21 cm Tube secured with: Tape Dental Injury: Teeth and Oropharynx as per pre-operative assessment

## 2016-06-06 NOTE — Interval H&P Note (Signed)
History and Physical Interval Note:  06/06/2016 10:52 AM  Susan Meyers  has presented today for surgery, with the diagnosis of rectoprolapse  The various methods of treatment have been discussed with the patient and family. After consideration of risks, benefits and other options for treatment, the patient has consented to  Procedure(s): XI ROBOTIC RECTOSIGMOID RESECTION  RECTOPEXY (N/A) RIGID PROCTOSCOPY (N/A) as a surgical intervention .  The patient's history has been reviewed, patient examined, no change in status, stable for surgery.  I have reviewed the patient's chart and labs.  Questions were answered to the patient's satisfaction.     Random Dobrowski C.

## 2016-06-06 NOTE — Transfer of Care (Signed)
Immediate Anesthesia Transfer of Care Note  Patient: Susan Meyers  Procedure(s) Performed: Procedure(s): XI ROBOTIC RECTOSIGMOID RESECTION  RECTOPEXY (N/A) RIGID PROCTOSCOPY (N/A)  Patient Location: PACU  Anesthesia Type:General  Level of Consciousness:  sedated, patient cooperative and responds to stimulation  Airway & Oxygen Therapy:Patient Spontanous Breathing and Patient connected to face mask oxgen  Post-op Assessment:  Report given to PACU RN and Post -op Vital signs reviewed and stable  Post vital signs:  Reviewed and stable  Last Vitals: There were no vitals filed for this visit.  Complications: No apparent anesthesia complications

## 2016-06-06 NOTE — Anesthesia Postprocedure Evaluation (Signed)
Anesthesia Post Note  Patient: Marnesha A Stonerock  Procedure(s) Performed: Procedure(s) (LRB): XI ROBOTIC  LOW ANTERIOR RESECTION AND RECTOPEXY AND RIGID PROCTOSCOPY (N/A) RIGID PROCTOSCOPY (N/A)  Patient location during evaluation: PACU Anesthesia Type: General Level of consciousness: awake and alert Pain management: pain level controlled Vital Signs Assessment: post-procedure vital signs reviewed and stable Respiratory status: spontaneous breathing, nonlabored ventilation, respiratory function stable and patient connected to nasal cannula oxygen Cardiovascular status: blood pressure returned to baseline and stable Postop Assessment: no signs of nausea or vomiting Anesthetic complications: no    Last Vitals:  Filed Vitals:   06/06/16 1715 06/06/16 1740  BP: 118/76 107/84  Pulse: 84 84  Temp: 36.6 C 36.9 C  Resp: 15 15    Last Pain:  Filed Vitals:   06/06/16 1741  PainSc: Asleep                 Tiajuana Amass

## 2016-06-06 NOTE — H&P (View-Only) (Signed)
[place] 02/27/2016 1:41 PM Location: [facility] Patient #: [id number] DOB: 1976/12/01 Widowed / Language: [place] / Race: Undefined Female  Patient Care Team: Leamon Arnt, MD as PCP - General (Family Medicine) Michael Boston, MD as Consulting Physician (General Surgery)   History of Present Illness  The patient is a 40 year old female who presents with rectal prolapse.   Pleasant woman originally from [place]. Relocated to [place] 2012. Noted since her last pregnancy worsening prolapse of tissue out her rectum. Has progressively worsened. She notes several inches can fallout when she moves her bowels. She has to push it back in. No history of hemorrhoid problems. She already has a bowel movement every day. No bad history of constipation or diarrhea in the past. Her maternal grandmother did have colon cancer diagnosed in her 61. Therefore, the patient had colonoscopy a few years ago. She recalls a polyp removed that was not precancerous (? Hyperplastic). That was in 2012. No incontinence to flatus or stool. Often has drainage and mucus now. She is evaluated 2014. [place] in rectal prolapse my colorectal surgeon in [place]. Surgery offered. She is in between jobs held off. However things have progressed. She is in a better situations in which to deal with now.  Therefore her primary care physician, Dr. [name], requested surgical consultation.  No personal nor family history of inflammatory bowel disease, allergy such as Celiac Sprue, dietary/dairy problems, colitis, ulcers nor gastritis. No recent sick contacts/gastroenteritis. No travel outside the country. No changes in diet. No dysphagia to solids or liquids. No significant heartburn or reflux. No hematochezia, hematemesis, coffee ground emesis. No evidence of prior gastric/peptic ulceration. She recalls being told she had irritable bowel syndrome but denies any episodes of  intermittent constipation nor diarrhea.   Other Problems [name], CMA; 02/27/2016 1:42 PM) Hemorrhoids  Past Surgical History [name], CMA; 02/27/2016 1:42 PM) Colon Polyp Removal - Open  Diagnostic Studies History [name], CMA; 02/27/2016 1:42 PM) Colonoscopy never Mammogram within last year  Allergies [place], CMA; 02/27/2016 1:43 PM) No Known Drug Allergies03/05/2016  Medication History ([place], CMA; 02/27/2016 1:43 PM) ValACYclovir HCl (500MG  Tablet, Oral as needed) Active. Medications Reconciled  Pregnancy / Birth History [name], CMA; 02/27/2016 1:42 PM) Age at menarche 18 years. Contraceptive History Intrauterine device. Gravida 4 Irregular periods Maternal age 96-20 Para 2    Review of Systems ([place]; 02/27/2016 1:42 PM) General Present- Fatigue. Not Present- Appetite Loss, Chills, Fever, Night Sweats, Weight Gain and Weight Loss. HEENT Present- Sinus Pain and Wears glasses/contact lenses. Not Present- Earache, Hearing Loss, Hoarseness, Nose Bleed, Oral Ulcers, Ringing in the Ears, Seasonal Allergies, Sore Throat, Visual Disturbances and Yellow Eyes. Gastrointestinal Present- Bloody Stool. Not Present- Abdominal Pain, Bloating, Change in Bowel Habits, Chronic diarrhea, Constipation, Difficulty Swallowing, Excessive gas, Gets full quickly at meals, Hemorrhoids, Indigestion, Nausea, Rectal Pain and Vomiting. Neurological Present- Headaches. Not Present- Decreased Memory, Fainting, Numbness, Seizures, Tingling, Tremor, Trouble walking and Weakness. Psychiatric Not Present- Anxiety, Bipolar, Change in Sleep Pattern, Depression, Fearful and Frequent crying. Endocrine Not Present- Cold Intolerance, Excessive Hunger, Hair Changes, Heat Intolerance, Hot flashes and New Diabetes. Hematology Not Present- Easy Bruising, Excessive bleeding, Gland problems, HIV and Persistent Infections.  Vitals ([place] CMA; 02/27/2016 1:42 PM) 02/27/2016  1:42 PM Weight: 135 lb Height: 64in Body Surface Area: 1.66 m Body Mass Index: 23.17 kg/m  Temp.: 98.10F(Temporal)  Pulse: 81 (Regular)  BP: 124/76 (Sitting, Left Arm, Standard)  Physical Exam [name]; 02/27/2016 2:42 PM) General Mental Status-Alert. General Appearance-Not in acute distress, Not Sickly. Orientation-Oriented X3. Hydration-Well hydrated. Voice-Normal. Note: Pleasant, chatty   Integumentary Global Assessment Upon inspection and palpation of skin surfaces of the - Axillae: non-tender, no inflammation or ulceration, no drainage. and Distribution of scalp and body hair is normal. General Characteristics Temperature - normal warmth is noted.  Head and Neck Head-normocephalic, atraumatic with no lesions or palpable masses. Face Global Assessment - atraumatic, no absence of expression. Neck Global Assessment - no abnormal movements, no bruit auscultated on the right, no bruit auscultated on the left, no decreased range of motion, non-tender. Trachea-midline. Thyroid Gland Characteristics - non-tender.  Eye Eyeball - Left-Extraocular movements intact, No Nystagmus. Eyeball - Right-Extraocular movements intact, No Nystagmus. Cornea - Left-No Hazy. Cornea - Right-No Hazy. Sclera/Conjunctiva - Left-No scleral icterus, No Discharge. Sclera/Conjunctiva - Right-No scleral icterus, No Discharge. Pupil - Left-Direct reaction to light normal. Pupil - Right-Direct reaction to light normal.  ENMT Ears Pinna - Left - no drainage observed, no generalized tenderness observed. Right - no drainage observed, no generalized tenderness observed. Nose and Sinuses External Inspection of the Nose - no destructive lesion observed. Inspection of the nares - Left - quiet respiration. Right - quiet respiration. Mouth and Throat Lips - Upper Lip - no fissures observed, no pallor noted. Lower Lip - no fissures observed, no  pallor noted. Nasopharynx - no discharge present. Oral Cavity/Oropharynx - Tongue - no dryness observed. Oral Mucosa - no cyanosis observed. Hypopharynx - no evidence of airway distress observed.  Chest and Lung Exam Inspection Movements - Normal and Symmetrical. Accessory muscles - No use of accessory muscles in breathing. Palpation Palpation of the chest reveals - Non-tender. Auscultation Breath sounds - Normal and Clear.  Cardiovascular Auscultation Rhythm - Regular. Murmurs & Other Heart Sounds - Auscultation of the heart reveals - No Murmurs and No Systolic Clicks.  Abdomen Inspection Inspection of the abdomen reveals - No Visible peristalsis and No Abnormal pulsations. Umbilicus - No Bleeding, No Urine drainage. Palpation/Percussion Palpation and Percussion of the abdomen reveal - Soft, Non Tender, No Rebound tenderness, No Rigidity (guarding) and No Cutaneous hyperesthesia.  Female Genitourinary Sexual Maturity Tanner 5 - Adult hair pattern. Note: Labia normal. No Bartholin's cyst inflammation. No rectocele. No cystocele. No vaginal bleeding nor discharge   Rectal Note: Examination done was female assistant present. Perianal skin clear. Mildly enlarged midline raphae tissue. Slightly reduced but intact sphincter tone. Redundant rectum on palpation. Not consistent with hemorrhoids. Circumferential , especially posteriorly. Irritation consistent with prolapse. No rectocele. No cystocele. On anoscopy moderate volume of rectal tissue easily prolapses out. From another colorectal surgeon the year before was able to reproduce prolapse with her sitting on the toilet & straining for 10 minutes. Given that, did not feel needed to repeat.   Peripheral Vascular Upper Extremity Inspection - Left - No Cyanotic nailbeds, Not Ischemic. Right - No Cyanotic nailbeds, Not Ischemic.  Neurologic Neurologic evaluation reveals -normal attention span and ability to concentrate, able  to name objects and repeat phrases. Appropriate fund of knowledge , normal sensation and normal coordination. Mental Status Affect - not angry, not paranoid. Cranial Nerves-Normal Bilaterally. Gait-Normal.  Neuropsychiatric Mental status exam performed with findings of-able to articulate well with normal speech/language, rate, volume and coherence, thought content normal with ability to perform basic computations and apply abstract reasoning and no evidence of hallucinations, delusions, obsessions or homicidal/suicidal ideation.  Musculoskeletal Global Assessment Spine, Ribs  and Pelvis - no instability, subluxation or laxity. Right Upper Extremity - no instability, subluxation or laxity.  Lymphatic Head & Neck  General Head & Neck Lymphatics: Bilateral - Description - No Localized lymphadenopathy. Axillary  General Axillary Region: Bilateral - Description - No Localized lymphadenopathy. Femoral & Inguinal  Generalized Femoral & Inguinal Lymphatics: Left - Description - No Localized lymphadenopathy. Right - Description - No Localized lymphadenopathy.    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 Not consistent with simply hemorrhoid.  This will require more definitive surgery to treat. Given her young age, would do resection & rectopexy. Good candidate for a robotic/laparoscopic approach. She is anxious to proceed.  I did discuss with her the need for follow-up colonoscopy given her history of rectal polyps. However, she feels almost certain that this was a non-precancerous, hyperplastic polyp. Therefore colonoscopy in 10 years is reasonable (2021) she is going to check on the pathology report from his real. If it is adenomatous, I would recommend colonoscopy. If hyperplastic. Can wait. She would like to hold off. Current Plans Pt Education - [facility]  Pelvic Floor Exercises (Kegels) and Dysfunction HCI (Andrian Sabala)  ENCOUNTER FOR PREOPERATIVE EXAMINATION FOR GENERAL SURGICAL PROCEDURE (Z01.818) Current Plans You are being scheduled for surgery - Our schedulers will call you.  You should hear from our office's scheduling department within 5 working days about the location, date, and time of surgery. We try to make accommodations for patient's preferences in scheduling surgery, but sometimes the OR schedule or the surgeon's schedule prevents [place] from making those accommodations.  If you have not heard from our office [phone]) in 5 working days, call the office and ask for your surgeon's nurse.  If you have other questions about your diagnosis, plan, or surgery, call the office and ask for your surgeon's nurse.  Written instructions provided Pt Education - [facility]CS Colon Bowel Prep 2015 Miralax/Antibiotics Started Neomycin Sulfate 500MG , 2 (two) Tablet SEE NOTE, #6, 02/27/2016, No Refill. Local Order: TAKE TWO TABLETS AT 2 PM, 3 PM, AND 10 PM THE DAY PRIOR TO SURGERY Started Flagyl 500MG , 2 (two) Tablet SEE NOTE, #6, 02/27/2016, No Refill. Local Order: Take at 2pm, 3pm, and 10pm the day prior to your colon operation Pt Education - [facility]CS Colectomy post-op instructions: discussed with patient and provided information. Pt Education - Pamphlet Given - Laparoscopic Colorectal Surgery: discussed with patient and provided information. Pt Education - [facility] Good Bowel Health (Yen Wandell) Pt Education - [facility] Pain Control (Chari Parmenter)   Signed by Adin Hector, MD   Adin Hector, M.D., F.A.C.S. Gastrointestinal and Minimally Invasive Surgery [facility] [address] [place], [place] [address] [phone] Main / Paging

## 2016-06-06 NOTE — Discharge Instructions (Signed)
SURGERY: POST OP INSTRUCTIONS °(Surgery for small bowel obstruction, colon resection, etc) ° ° °###################################################################### ° °EAT °Gradually transition to a high fiber diet with a fiber supplement over the next few days after discharge ° °WALK °Walk an hour a day.  Control your pain to do that.   ° °CONTROL PAIN °Control pain so that you can walk, sleep, tolerate sneezing/coughing, go up/down stairs. ° °HAVE A BOWEL MOVEMENT DAILY °Keep your bowels regular to avoid problems.  OK to try a laxative to override constipation.  OK to use an antidairrheal to slow down diarrhea.  Call if not better after 2 tries ° °CALL IF YOU HAVE PROBLEMS/CONCERNS °Call if you are still struggling despite following these instructions. °Call if you have concerns not answered by these instructions ° °###################################################################### ° ° °DIET °Follow a light diet the first few days at home.  Start with a bland diet such as soups, liquids, starchy foods, low fat foods, etc.  If you feel full, bloated, or constipated, stay on a ful liquid or pureed/blenderized diet for a few days until you feel better and no longer constipated. °Be sure to drink plenty of fluids every day to avoid getting dehydrated (feeling dizzy, not urinating, etc.). °Gradually add a fiber supplement to your diet over the next week.  Gradually get back to a regular solid diet.  Avoid fast food or heavy meals the first week as you are more likely to get nauseated. °It is expected for your digestive tract to need a few months to get back to normal.  It is common for your bowel movements and stools to be irregular.  You will have occasional bloating and cramping that should eventually fade away.  Until you are eating solid food normally, off all pain medications, and back to regular activities; your bowels will not be normal. °Focus on eating a low-fat, high fiber diet the rest of your life  (See Getting to Good Bowel Health, below). ° °CARE of your INCISION or WOUND °It is good for closed incision and even open wounds to be washed every day.  Shower every day.  Short baths are fine.  Wash the incisions and wounds clean with soap & water.    °If you have a closed incision(s), wash the incision with soap & water every day.  You may leave closed incisions open to air if it is dry.   You may cover the incision with clean gauze & replace it after your daily shower for comfort. °If you have skin tapes (Steristrips) or skin glue (Dermabond) on your incision, leave them in place.  They will fall off on their own like a scab.  You may trim any edges that curl up with clean scissors.  If you have staples, set up an appointment for them to be removed in the office in 10 days after surgery.  °If you have a drain, wash around the skin exit site with soap & water and place a new dressing of gauze or band aid around the skin every day.  Keep the drain site clean & dry.    °If you have an open wound with packing, see wound care instructions.  In general, it is encouraged that you remove your dressing and packing, shower with soap & water, and replace your dressing once a day.  Pack the wound with clean gauze moistened with normal (0.9%) saline to keep the wound moist & uninfected.  Pressure on the dressing for 30 minutes will stop most wound   bleeding.  Eventually your body will heal & pull the open wound closed over the next few months.  °Raw open wounds will occasionally bleed or secrete yellow drainage until it heals closed.  Drain sites will drain a little until the drain is removed.  Even closed incisions can have mild bleeding or drainage the first few days until the skin edges scab over & seal.   °If you have an open wound with a wound vac, see wound vac care instructions. ° ° ° ° °ACTIVITIES as tolerated °Start light daily activities --- self-care, walking, climbing stairs-- beginning the day after surgery.   Gradually increase activities as tolerated.  Control your pain to be active.  Stop when you are tired.  Ideally, walk several times a day, eventually an hour a day.   °Most people are back to most day-to-day activities in a few weeks.  It takes 4-8 weeks to get back to unrestricted, intense activity. °If you can walk 30 minutes without difficulty, it is safe to try more intense activity such as jogging, treadmill, bicycling, low-impact aerobics, swimming, etc. °Save the most intensive and strenuous activity for last (Usually 4-8 weeks after surgery) such as sit-ups, heavy lifting, contact sports, etc.  Refrain from any intense heavy lifting or straining until you are off narcotics for pain control.  You will have off days, but things should improve week-by-week. °DO NOT PUSH THROUGH PAIN.  Let pain be your guide: If it hurts to do something, don't do it.  Pain is your body warning you to avoid that activity for another week until the pain goes down. °You may drive when you are no longer taking narcotic prescription pain medication, you can comfortably wear a seatbelt, and you can safely make sudden turns/stops to protect yourself without hesitating due to pain. °You may have sexual intercourse when it is comfortable. If it hurts to do something, stop. ° °MEDICATIONS °Take your usually prescribed home medications unless otherwise directed.   °Blood thinners:  °Usually you can restart any strong blood thinners after the second postoperative day.  It is OK to take aspirin right away.    ° If you are on strong blood thinners (warfarin/Coumadin, Plavix, Xerelto, Eliquis, Pradaxa, etc), discuss with your surgeon, medicine PCP, and/or cardiologist for instructions on when to restart the blood thinner & if blood monitoring is needed (PT/INR blood check, etc).   ° ° °PAIN CONTROL °Pain after surgery or related to activity is often due to strain/injury to muscle, tendon, nerves and/or incisions.  This pain is usually  short-term and will improve in a few months.  °To help speed the process of healing and to get back to regular activity more quickly, DO THE FOLLOWING THINGS TOGETHER: °1. Increase activity gradually.  DO NOT PUSH THROUGH PAIN °2. Use Ice and/or Heat °3. Try Gentle Massage and/or Stretching °4. Take over the counter pain medication °5. Take Narcotic prescription pain medication for more severe pain ° °Good pain control = faster recovery.  It is better to take more medicine to be more active than to stay in bed all day to avoid medications. °1.  Increase activity gradually °Avoid heavy lifting at first, then increase to lifting as tolerated over the next 6 weeks. °Do not “push through” the pain.  Listen to your body and avoid positions and maneuvers than reproduce the pain.  Wait a few days before trying something more intense °Walking an hour a day is encouraged to help your body recover faster   and more safely.  Start slowly and stop when getting sore.  If you can walk 30 minutes without stopping or pain, you can try more intense activity (running, jogging, aerobics, cycling, swimming, treadmill, sex, sports, weightlifting, etc.) °Remember: If it hurts to do it, then don’t do it! °2. Use Ice and/or Heat °You will have swelling and bruising around the incisions.  This will take several weeks to resolve. °Ice packs or heating pads (6-8 times a day, 30-60 minutes at a time) will help sooth soreness & bruising. °Some people prefer to use ice alone, heat alone, or alternate between ice & heat.  Experiment and see what works best for you.  Consider trying ice for the first few days to help decrease swelling and bruising; then, switch to heat to help relax sore spots and speed recovery. °Shower every day.  Short baths are fine.  It feels good!  Keep the incisions and wounds clean with soap & water.   °3. Try Gentle Massage and/or Stretching °Massage at the area of pain many times a day °Stop if you feel pain - do not  overdo it °4. Take over the counter pain medication °This helps the muscle and nerve tissues become less irritable and calm down faster °Choose ONE of the following over-the-counter anti-inflammatory medications: °Acetaminophen 500mg tabs (Tylenol) 1-2 pills with every meal and just before bedtime (avoid if you have liver problems or if you have acetaminophen in you narcotic prescription) °Naproxen 220mg tabs (ex. Aleve, Naprosyn) 1-2 pills twice a day (avoid if you have kidney, stomach, IBD, or bleeding problems) °Ibuprofen 200mg tabs (ex. Advil, Motrin) 3-4 pills with every meal and just before bedtime (avoid if you have kidney, stomach, IBD, or bleeding problems) °Take with food/snack several times a day as directed for at least 2 weeks to help keep pain / soreness down & more manageable. °5. Take Narcotic prescription pain medication for more severe pain °A prescription for strong pain control is often given to you upon discharge (for example: oxycodone/Percocet, hydrocodone/Norco/Vicodin, or tramadol/Ultram) °Take your pain medication as prescribed. °Be mindful that most narcotic prescriptions contain Tylenol (acetaminophen) as well - avoid taking too much Tylenol. °If you are having problems/concerns with the prescription medicine (does not control pain, nausea, vomiting, rash, itching, etc.), please call us [phone] to see if we need to switch you to a different pain medicine that will work better for you and/or control your side effects better. °If you need a refill on your pain medication, you must call the office before 4 pm and on weekdays only.  By federal law, prescriptions for narcotics cannot be called into a pharmacy.  They must be filled out on paper & picked up from our office by the patient or authorized caretaker.  Prescriptions cannot be filled after 4 pm nor on weekends.   ° °WHEN TO CALL US [phone] °Severe uncontrolled or worsening pain  °Fever over 101 F (38.5 C) °Concerns with  the incision: Worsening pain, redness, rash/hives, swelling, bleeding, or drainage °Reactions / problems with new medications (itching, rash, hives, nausea, etc.) °Nausea and/or vomiting °Difficulty urinating °Difficulty breathing °Worsening fatigue, dizziness, lightheadedness, blurred vision °Other concerns °If you are not getting better after two weeks or are noticing you are getting worse, contact our office [phone] for further advice.  We may need to adjust your medications, re-evaluate you in the office, send you to the emergency room, or see what other things we can do to help. °The   clinic staff is available to answer your questions during regular business hours (8:30am-5pm).  Please don’t hesitate to call and ask to speak to one of our nurses for clinical concerns.    °A surgeon from [facility] is always on call at the hospitals 24 hours/day °If you have a medical emergency, go to the nearest emergency room or call 911. ° °FOLLOW UP in our office °One the day of your discharge from the hospital (or the next business weekday), please call [facility] to set up or confirm an appointment to see your surgeon in the office for a follow-up appointment.  Usually it is 2-3 weeks after your surgery.   °If you have skin staples at your incision(s), let the office know so we can set up a time in the office for the nurse to remove them (usually around 10 days after surgery). °Make sure that you call for appointments the day of discharge (or the next business weekday) from the hospital to ensure a convenient appointment time. °IF YOU HAVE DISABILITY OR FAMILY LEAVE FORMS, BRING THEM TO THE OFFICE FOR PROCESSING.  DO NOT GIVE THEM TO YOUR DOCTOR. ° °[facility] °[address], [address], [place], [place]  [address] ? °[phone] - Main °[phone] - Toll Free,  [phone] - Fax °[email or web address] ° °GETTING TO GOOD BOWEL HEALTH. °It is  expected for your digestive tract to need a few months to get back to normal.  It is common for your bowel movements and stools to be irregular.  You will have occasional bloating and cramping that should eventually fade away.  Until you are eating solid food normally, off all pain medications, and back to regular activities; your bowels will not be normal.   °Avoiding constipation °The goal: ONE SOFT BOWEL MOVEMENT A DAY!    °Drink plenty of fluids.  Choose water first. °TAKE A FIBER SUPPLEMENT EVERY DAY THE REST OF YOUR LIFE °During your first week back home, gradually add back a fiber supplement every day °Experiment which form you can tolerate.   There are many forms such as powders, tablets, wafers, gummies, etc °Psyllium bran (Metamucil), methylcellulose (Citrucel), Miralax or Glycolax, Benefiber, Flax Seed.  °Adjust the dose week-by-week (1/2 dose/day to 6 doses a day) until you are moving your bowels 1-2 times a day.  Cut back the dose or try a different fiber product if it is giving you problems such as diarrhea or bloating. °Sometimes a laxative is needed to help jump-start bowels if constipated until the fiber supplement can help regulate your bowels.  If you are tolerating eating & you are farting, it is okay to try a gentle laxative such as double dose MiraLax, prune juice, or Milk of Magnesia.  Avoid using laxatives too often. °Stool softeners can sometimes help counteract the constipating effects of narcotic pain medicines.  It can also cause diarrhea, so avoid using for too long. °If you are still constipated despite taking fiber daily, eating solids, and a few doses of laxatives, call our office. °Controlling diarrhea °Try drinking liquids and eating bland foods for a few days to avoid stressing your intestines further. °Avoid dairy products (especially milk & ice cream) for a short time.  The intestines often can lose the ability to digest lactose when stressed. °Avoid foods that cause gassiness or  bloating.  Typical foods include beans and other legumes, cabbage, broccoli, and dairy foods.  Avoid greasy, spicy, fast foods.  Every person has   some sensitivity to other foods, so listen to your body and avoid those foods that trigger problems for you. °Probiotics (such as active yogurt, Align, etc) may help repopulate the intestines and colon with normal bacteria and calm down a sensitive digestive tract °Adding a fiber supplement gradually can help thicken stools by absorbing excess fluid and retrain the intestines to act more normally.  Slowly increase the dose over a few weeks.  Too much fiber too soon can backfire and cause cramping & bloating. °It is okay to try and slow down diarrhea with a few doses of antidiarrheal medicines.   °Bismuth subsalicylate (ex. Kayopectate, Pepto Bismol) for a few doses can help control diarrhea.  Avoid if pregnant.   °Loperamide (Imodium) can slow down diarrhea.  Start with one tablet (2mg) first.  Avoid if you are having fevers or severe pain.  °ILEOSTOMY PATIENTS WILL HAVE CHRONIC DIARRHEA since their colon is not in use.    °Drink plenty of liquids.  You will need to drink even more glasses of water/liquid a day to avoid getting dehydrated. °Record output from your ileostomy.  Expect to empty the bag every 3-4 hours at first.  Most people with a permanent ileostomy empty their bag 4-6 times at the least.   °Use antidiarrheal medicine (especially Imodium) several times a day to avoid getting dehydrated.  Start with a dose at bedtime & breakfast.  Adjust up or down as needed.  Increase antidiarrheal medications as directed to avoid emptying the bag more than 8 times a day (every 3 hours). °Work with your wound ostomy nurse to learn care for your ostomy.  See ostomy care instructions. °TROUBLESHOOTING IRREGULAR BOWELS °1) Start with a soft & bland diet. No spicy, greasy, or fried foods.  °2) Avoid gluten/wheat or dairy products from diet to see if symptoms improve. °3) Miralax  17gm or flax seed mixed in 8oz. water or juice-daily. May use 2-4 times a day as needed. °4) Gas-X, Phazyme, etc. as needed for gas & bloating.  °5) Prilosec (omeprazole) over-the-counter as needed °6)  Consider probiotics (Align, Activa, etc) to help calm the bowels down ° °Call your doctor if you are getting worse or not getting better.  Sometimes further testing (cultures, endoscopy, X-ray studies, CT scans, bloodwork, etc.) may be needed to help diagnose and treat the cause of the diarrhea. °[facility] °[address], [address], [place], [place]  [address] °[phone] - Main.    °[phone]  - Toll Free.   [phone] - Fax °[email or web address] ° ° °

## 2016-06-06 NOTE — Op Note (Signed)
06/06/2016  3:48 PM  PATIENT:  Susan Meyers  40 y.o. female  Patient Care Team: Leamon Arnt, MD as PCP - General (Family Medicine) Michael Boston, MD as Consulting Physician (General Surgery)  PRE-OPERATIVE DIAGNOSIS:  Rectal Prolapse  POST-OPERATIVE DIAGNOSIS: Rectal Prolapse  PROCEDURE:   XI ROBOTIC RECTOSIGMOID RESECTION   RECTOPEXY RIGID PROCTOSCOPY  SURGEON: Michael Boston, MD  ASSISTANT: Leighton Ruff, MD   ANESTHESIA:   local and general  EBL:  Total I/O In: 2000 [I.V.:2000] Out: 300 [Urine:250; Blood:50]  Delay start of Pharmacological VTE agent (>24hrs) due to surgical blood loss or risk of bleeding:  no  DRAINS: none   SPECIMEN:  Source of Specimen:  RECTOSIGMOID COLON with distal anastomotic ring  DISPOSITION OF SPECIMEN:  PATHOLOGY  COUNTS:  YES  PLAN OF CARE: Admit to inpatient   PATIENT DISPOSITION:  PACU - hemodynamically stable.  INDICATION:    I recommended segmental resection:  The anatomy & physiology of the digestive tract was discussed.  The pathophysiology was discussed.  Natural history risks without surgery was discussed.   I worked to give an overview of the disease and the frequent need to have multispecialty involvement.  I feel the risks of no intervention will lead to serious problems that outweigh the operative risks; therefore, I recommended a partial colectomy to remove the pathology.  Laparoscopic & open techniques were discussed.   Risks such as bleeding, infection, abscess, leak, reoperation, possible ostomy, hernia, heart attack, death, and other risks were discussed.  I noted a good likelihood this will help address the problem.   Goals of post-operative recovery were discussed as well.  We will work to minimize complications.  Educational materials on the pathology had been given in the office.  Questions were answered.    The patient expressed understanding & wished to proceed with surgery.  OR FINDINGS:   Patient had  redundant rectosigmoid colon.  We can pelvic floor and sphincter tone but no sphincter defect.  Mild thickening of rectum but no active proctitis.  No obvious metastatic disease on visceral parietal peritoneum or liver.  The anastomosis rests 12 cm from the anal verge by rigid proctoscopy.  DESCRIPTION:   Informed consent was confirmed.  The patient underwent general anaesthesia without difficulty.  The patient was positioned appropriately.  VTE prevention in place.  The patient's abdomen was clipped, prepped, & draped in a sterile fashion.  Surgical timeout confirmed our plan.  The patient was positioned in reverse Trendelenburg.  Abdominal entry was gained using Varess technique with a trach hook on the anterior abdominal wall fascia in the right upper abdomen.  Entry was clean.  I induced carbon dioxide insufflation.  Camera inspection revealed no injury.  Extra ports were carefully placed under direct laparoscopic visualization.  I reflected the greater omentum and the upper abdomen the small bowel in the upper abdomen.  The patient was carefully positioned.  The Intuitive daVinci robot was carefully docked with camera & instruments carefully placed.  The patient had rather redundant sigmoid colon.  Rectum stretched out in length.  We placed the rectosigmoid colon under axial tension  I scored the base of peritoneum of the medial side of the mesentery of the left colon from the ligament of Treitz to the peritoneal reflection of the mid rectum.   I elevated the sigmoid mesentery and entered into the retro-mesenteric plane. We were able to identify the left ureter and gonadal vessels. We kept those posterior within the retroperitoneum and  elevated the left colon mesentery off that. I did isolate the inferior mesenteric artery (IMA) pedicle but did not ligate it yet.    I continued distally and got into the avascular plane posterior to the mesorectum.  She very thin with minimal mesenteric and  intraperitoneal fat.  Eventually had a nice plane, preserving the  Sympathetic wishbone nerves.  Continued distally to the pelvic floor distal to the coccyx and then focused on anterior dissection.  Free peritoneal coverings superficial to the lateral rectal pedicles and came around the very low peritoneal reflection.  Freed the rectum off the rectovaginal septum and followed up more distally as well.  With that I got much better of the rectum stretched out and brought out of the pelvis.  I stayed away from the right and left ureters.  I kept the lateral vascular pedicles to the rectum intact.  I skeletonized the mesorectum at the junction at the proximal and mid rectum for the distal point of resection.  I mobilized the rectosigmoid colon in a lateral to medial fashion off the line of Toldt up to just level descending colon.  I left attachments on the descending colon intact to make sure the left colon would not fall into the pelvis.  I skeletonized at the mid mesorectum and transected at the proximal rectum using a robotic 45 mm stapler.  80% on the first firing.  Last 20% with an extra fire.  I chose a region at the distal descending colon that was soft and easily reached down to the rectal stump.  I transected the mesentery of the colon radially to preserve remaining colon blood supply.  I transected the colon off the rectosigmoid mesentery and mesorectum, preserving the superior hemorrhoidal pedicle to ensure good blood supply.  I placed a wound protector through the 12 mm port site after extending the incision to 3 cm.  We split through the abdominal wall in a classic Pfannenstiel fashion.  I was able to eviscerate the rectosigmoid and descending colon out the wound.   I clamped the colon proximal to this area using a soft bowel clamp. I transected at the descending/sigmoid junction with a scalpel. I got healthy bleeding mucosa.  We sent the rectosigmoid colon specimen off to go to pathology.  We sized  the colon orifice.  I chose a 33 EEA anvil stapler system. I placed the anvil to the open end of the proximal remaining colon and closed around it using a 0 Prolene pursestring.  We did copious irrigation with crystalloid solution.  Hemostasis was good.  The distal end of the remaining colon snuggly reached down to the rectal stump  Dr Marcello Moores scrubbed down and did gentle anal dilation and advanced the EEA stapler up the rectal stump. The spike was brought out at the provimal end of the rectal stump under direct visualization.  I attached the anvil of the proximal colon the spike of the stapler. Anvil was tightened down and held clamped for 60 seconds. The EEA stapler was fired and held clamped for 30 seconds. The stapler was released & removed. We noted 2 excellent anastomotic rings. Blue stitch is in the proximal ring.  Dr Marcello Moores did rigid proctoscopy noted the anastomosis was at 12 cm from the anal verge consistent with the proximal rectum.  We did a final irrigation of antibiotic solution (900 mg clindamycin/240 mg gentamicin in a liter of crystalloid) & held that for the pelvic air leak test .  The rectum was insufflated the  rectum while clamping the colon proximal to that anastomosis.  There was a negative air leak test. There was no tension of mesentery or bowel at the anastomosis.   Tissues looked viable.  Ureters & bowel uninjured.  The anastomosis looked healthy.  Endoluminal gas was evacuated.  Ports & wound protector removed.  We changed gloves.  We aspirated the antibiotic irrigation.  Hemostasis was good.  Sterile unused instruments were used from this point out per colon SSI prevention protocol.  I closed the 13mm port sites using Monocryl stitch and sterile dressing.  I closed the extraction wound using a 0 Vicryl vertical peritoneal closure and a #1 PDS transverse anterior rectal fascial closure. I closed the skin with some interrupted Monocryl stitches. I placed antibiotic-soaked wicks into the  closure at the corners x2 total. I placed a sterile dressing.    Patient is being extubated go to recovery room. I had discussed postop care with the patient in detail the office & in the holding area. Instructions are written.  I'm about to locate her significant other / family and discuss it with them as well.  Adin Hector, M.D., F.A.C.S. Gastrointestinal and Minimally Invasive Surgery [facility] [address] [place], [place] [address] [phone] Main / Paging

## 2016-06-07 ENCOUNTER — Encounter (HOSPITAL_COMMUNITY): Payer: Self-pay | Admitting: Surgery

## 2016-06-07 LAB — BASIC METABOLIC PANEL
Anion gap: 7 (ref 5–15)
BUN: 6 mg/dL (ref 6–20)
CHLORIDE: 106 mmol/L (ref 101–111)
CO2: 25 mmol/L (ref 22–32)
CREATININE: 0.58 mg/dL (ref 0.44–1.00)
Calcium: 8.7 mg/dL — ABNORMAL LOW (ref 8.9–10.3)
GFR calc Af Amer: >60 mL/min (ref >60)
GFR calc non Af Amer: >60 mL/min (ref >60)
GLUCOSE: 91 mg/dL (ref 65–99)
POTASSIUM: 3.5 mmol/L (ref 3.5–5.1)
SODIUM: 138 mmol/L (ref 135–145)

## 2016-06-07 LAB — CBC
HEMATOCRIT: 34.2 % — AB (ref 36.0–46.0)
Hemoglobin: 11.8 g/dL — ABNORMAL LOW (ref 12.0–15.0)
MCH: 32.2 pg (ref 26.0–34.0)
MCHC: 34.5 g/dL (ref 30.0–36.0)
MCV: 93.4 fL (ref 78.0–100.0)
PLATELETS: 284 10*3/uL (ref 150–400)
RBC: 3.66 MIL/uL — ABNORMAL LOW (ref 3.87–5.11)
RDW: 12.8 % (ref 11.5–15.5)
WBC: 9.7 10*3/uL (ref 4.0–10.5)

## 2016-06-07 LAB — MAGNESIUM: Magnesium: 1.8 mg/dL (ref 1.7–2.4)

## 2016-06-07 MED ORDER — SODIUM CHLORIDE 0.9% FLUSH
3.0000 mL | Freq: Two times a day (BID) | INTRAVENOUS | Status: DC
  Administered 2016-06-07 – 2016-06-08 (×2): 3 mL via INTRAVENOUS

## 2016-06-07 MED ORDER — LACTATED RINGERS IV BOLUS (SEPSIS): 1000.0000 mL | Freq: Once | INTRAVENOUS | Status: DC

## 2016-06-07 MED ORDER — SODIUM CHLORIDE 0.9% FLUSH: 3.0000 mL | INTRAVENOUS | Status: DC | PRN

## 2016-06-07 MED ORDER — SODIUM CHLORIDE 0.9 % IV SOLN: 250.0000 mL | INTRAVENOUS | Status: DC | PRN

## 2016-06-07 NOTE — Progress Notes (Addendum)
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 We can pelvic floor and sphincter tone but no sphincter defect. Mild thickening of rectum but no active proctitis.  No obvious metastatic disease on visceral parietal peritoneum or liver.  The anastomosis rests 12 cm from the anal verge by rigid proctoscopy   Assessment  Recovering OK  Plan:  -IVF - bolus & wean -try pureed/fulls -f/u pathology -allergy control -VTE prophylaxis- SCDs, etc -mobilize as tolerated to help recovery  D/C patient from hospital when patient meets criteria (anticipate in 1-3 day(s)):  Tolerating oral intake well Ambulating well Adequate pain control without IV medications Urinating  Having flatus Disposition planning in place  I updated the patient's status to the patient and nurse.  Recommendations were made.  Questions were answered.  They expressed understanding & appreciation.   Susan Meyers, M.D., F.A.C.S. Gastrointestinal and Minimally Invasive  Surgery [facility] [address] [place], [place] [address] [phone] Main / Paging   06/07/2016  Subjective:  N/V last night - feels much better now tol liquids A little light headed standing up last night No pain  Objective:  Vital signs:  Filed Vitals:   06/06/16 2028 06/06/16 2144 06/07/16 0149 06/07/16 0434  BP: 112/70 115/65 111/71 103/61  Pulse: 74 75 79 65  Temp: 99.2 F (37.3 C) 98.9 F (37.2 C) 100.1 F (37.8 C) 99.3 F (37.4 C)  TempSrc: Oral Oral Oral Oral  Resp: '14 16 14 16  '$ Height:      Weight:      SpO2: 99% 100% 99% 99%       Intake/Output   Yesterday:  06/14 0701 - 06/15 0700 In: 3055.8 [I.V.:3055.8] Out: 950 [Urine:900; Blood:50] This shift:     Bowel function:  Flatus: No  BM:  No  Drain: (No drain)   Physical Exam:  General: Pt awake/alert/oriented x4 in No acute distress Eyes: PERRL, normal EOM.  Sclera clear.  No icterus Neuro: CN II-XII intact w/o focal sensory/motor deficits. Lymph: No head/neck/groin lymphadenopathy Psych:  No delerium/psychosis/paranoia HENT: Normocephalic, Mucus membranes moist.  No thrush Neck: Supple, No tracheal deviation Chest: No chest wall pain w good excursion CV:  Pulses intact.  Regular rhythm MS: Normal AROM mjr joints.  No obvious deformity Abdomen: Soft.  Nondistended.  Nontender.  No evidence of peritonitis.  No incarcerated hernias. Ext:  SCDs BLE.  No mjr edema.  No cyanosis Skin: No petechiae / purpura  Results:   Labs: Results for orders placed or performed during the hospital encounter of 06/06/16 (from the past 48 hour(s))  Pregnancy, urine     Status: None  Collection Time: 06/06/16 10:35 AM  Result Value Ref Range   Preg Test, Ur NEGATIVE NEGATIVE    Comment:        THE SENSITIVITY OF THIS METHODOLOGY IS >20 mIU/mL.   Basic metabolic panel     Status: Abnormal   Collection Time: 06/07/16  4:39 AM  Result Value Ref Range   Sodium  138 135 - 145 mmol/L   Potassium 3.5 3.5 - 5.1 mmol/L   Chloride 106 101 - 111 mmol/L   CO2 25 22 - 32 mmol/L   Glucose, Bld 91 65 - 99 mg/dL   BUN 6 6 - 20 mg/dL   Creatinine, Ser 0.58 0.44 - 1.00 mg/dL   Calcium 8.7 (L) 8.9 - 10.3 mg/dL   GFR calc non Af Amer >60 >60 mL/min   GFR calc Af Amer >60 >60 mL/min    Comment: (NOTE) The eGFR has been calculated using the CKD EPI equation. This calculation has not been validated in all clinical situations. eGFR's persistently <60 mL/min signify possible Chronic Kidney Disease.    Anion gap 7 5 - 15  CBC     Status: Abnormal   Collection Time: 06/07/16  4:39 AM  Result Value Ref Range   WBC 9.7 4.0 - 10.5 K/uL   RBC 3.66 (L) 3.87 - 5.11 MIL/uL   Hemoglobin 11.8 (L) 12.0 - 15.0 g/dL   HCT 34.2 (L) 36.0 - 46.0 %   MCV 93.4 78.0 - 100.0 fL   MCH 32.2 26.0 - 34.0 pg   MCHC 34.5 30.0 - 36.0 g/dL   RDW 12.8 11.5 - 15.5 %   Platelets 284 150 - 400 K/uL  Magnesium     Status: None   Collection Time: 06/07/16  4:39 AM  Result Value Ref Range   Magnesium 1.8 1.7 - 2.4 mg/dL    Imaging / Studies: No results found.  Medications / Allergies: per chart  Antibiotics: Anti-infectives    Start     Dose/Rate Route Frequency Ordered Stop   06/06/16 2300  cefoTEtan (CEFOTAN) 2 g in dextrose 5 % 50 mL IVPB     2 g 100 mL/hr over 30 Minutes Intravenous Every 12 hours 06/06/16 1742 06/06/16 2307   06/06/16 1742  valACYclovir (VALTREX) tablet 500 mg     500 mg Oral Daily PRN 06/06/16 1742     06/06/16 1320  clindamycin (CLEOCIN) 900 mg, gentamicin (GARAMYCIN) 240 mg in sodium chloride 0.9 % 1,000 mL for intraperitoneal lavage  Status:  Discontinued       As needed 06/06/16 1320 06/06/16 1601   06/06/16 1020  cefoTEtan (CEFOTAN) 2 g in dextrose 5 % 50 mL IVPB     2 g 100 mL/hr over 30 Minutes Intravenous On call to O.R. 06/06/16 1020 06/06/16 1230   06/06/16 0600  clindamycin (CLEOCIN) 900 mg, gentamicin (GARAMYCIN) 240 mg in sodium chloride 0.9  % 1,000 mL for intraperitoneal lavage  Status:  Discontinued    Comments:  Pharmacy may adjust dosing strength, schedule, rate of infusion, etc as needed to optimize therapy    Intraperitoneal To Surgery 06/05/16 1340 06/06/16 1732        Note: Portions of this report may have been transcribed using voice recognition software. Every effort was made to ensure accuracy; however, inadvertent computerized transcription errors may be present.   Any transcriptional errors that result from this process are unintentional.     Susan Meyers, M.D., F.A.C.S. Gastrointestinal and Minimally Invasive Surgery [facility] [address], [place], [place] [address] [phone] Main / Paging   06/07/2016

## 2016-06-07 NOTE — Progress Notes (Signed)
Patient walked another 133ft.

## 2016-06-07 NOTE — Progress Notes (Signed)
Attempted to walk patient- patient stated that she was dizzy and wanted to sit down. Will try again.

## 2016-06-07 NOTE — Progress Notes (Signed)
Patient encouraged to walk with RN- patient walked about 138ft with RN and patient's mother. Patient also encouraged to take a shower- she states that she will take a nap first before calling for assistance to take a shower.

## 2016-06-08 LAB — CREATININE, SERUM
CREATININE: 0.57 mg/dL (ref 0.44–1.00)
GFR calc Af Amer: >60 mL/min (ref >60)

## 2016-06-08 LAB — HEMOGLOBIN: Hemoglobin: 12.5 g/dL (ref 12.0–15.0)

## 2016-06-08 LAB — POTASSIUM: POTASSIUM: 3.8 mmol/L (ref 3.5–5.1)

## 2016-06-08 MED ORDER — OXYCODONE HCL 5 MG PO TABS
5.0000 mg | ORAL_TABLET | ORAL | Status: DC | PRN
  Administered 2016-06-08: 5 mg via ORAL
  Administered 2016-06-08: 10 mg via ORAL
  Filled 2016-06-08: qty 1
  Filled 2016-06-08 (×2): qty 2

## 2016-06-08 NOTE — Discharge Summary (Signed)
Physician Discharge Summary  Patient ID: Susan Meyers MRN: [id number] DOB/AGE: 06/11/1976 40 y.o.  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  Postoperatively, the patient gradually mobilized and advanced to a solid diet.  Pain and other symptoms were treated aggressively.    By the time of discharge, the patient was walking well the hallways, eating food, having flatus.  Pain was well-controlled on an oral medications.  Based on meeting discharge criteria and continuing to recover, I felt it was safe for the patient to be discharged from the hospital to further recover with close followup. Postoperative recommendations were discussed in detail.  They are written as well.   Significant Diagnostic Studies:  Results for orders placed or performed during the hospital encounter of 06/06/16 (from the past 72 hour(s))  Pregnancy, urine     Status: None   Collection Time: 06/06/16 10:35 AM  Result Value Ref Range   Preg Test, Ur NEGATIVE NEGATIVE    Comment:        THE SENSITIVITY OF THIS METHODOLOGY IS >20 mIU/mL.   Basic metabolic panel     Status: Abnormal   Collection Time: 06/07/16  4:39 AM  Result Value Ref Range   Sodium 138 135 - 145 mmol/L   Potassium 3.5 3.5 - 5.1 mmol/L   Chloride 106 101 - 111 mmol/L    CO2 25 22 - 32 mmol/L   Glucose, Bld 91 65 - 99 mg/dL   BUN 6 6 - 20 mg/dL   Creatinine, Ser 0.58 0.44 - 1.00 mg/dL   Calcium 8.7 (L) 8.9 - 10.3 mg/dL   GFR calc non Af Amer >60 >60 mL/min   GFR calc Af Amer >60 >60 mL/min    Comment: (NOTE) The eGFR has been calculated using the CKD EPI equation. This calculation has not been validated in all clinical situations. eGFR's persistently <60 mL/min signify possible Chronic Kidney Disease.    Anion gap 7 5 - 15  CBC     Status: Abnormal   Collection Time: 06/07/16  4:39 AM  Result Value Ref Range   WBC 9.7 4.0 - 10.5 K/uL   RBC 3.66 (L) 3.87 - 5.11 MIL/uL   Hemoglobin 11.8 (L) 12.0 - 15.0 g/dL   HCT 34.2 (L) 36.0 - 46.0 %   MCV 93.4 78.0 - 100.0 fL   MCH 32.2 26.0 - 34.0 pg   MCHC 34.5 30.0 - 36.0 g/dL   RDW 12.8 11.5 - 15.5 %   Platelets 284 150 - 400 K/uL  Magnesium     Status: None   Collection Time: 06/07/16  4:39 AM  Result Value Ref Range   Magnesium 1.8 1.7 - 2.4 mg/dL  Hemoglobin     Status: None   Collection Time: 06/08/16  4:06 AM  Result Value Ref Range   Hemoglobin 12.5 12.0 - 15.0 g/dL  Potassium     Status: None  Collection Time: 06/08/16  4:06 AM  Result Value Ref Range   Potassium 3.8 3.5 - 5.1 mmol/L  Creatinine, serum     Status: None   Collection Time: 06/08/16  4:06 AM  Result Value Ref Range   Creatinine, Ser 0.57 0.44 - 1.00 mg/dL   GFR calc non Af Amer >60 >60 mL/min   GFR calc Af Amer >60 >60 mL/min    Comment: (NOTE) The eGFR has been calculated using the CKD EPI equation. This calculation has not been validated in all clinical situations. eGFR's persistently <60 mL/min signify possible Chronic Kidney Disease.     No results found.  Discharge Exam: Blood pressure 97/63, pulse 76, temperature 99.1 F (37.3 C), temperature source Oral, resp. rate 14, height _0  (1.626 m), weight 64.2 kg (141 lb 8.6 oz), SpO2 96 %.  General: Pt awake/alert/oriented x4 in no major acute distress Eyes:  PERRL, normal EOM. Sclera nonicteric Neuro: CN II-XII intact w/o focal sensory/motor deficits. Lymph: No head/neck/groin lymphadenopathy Psych:  No delerium/psychosis/paranoia HENT: Normocephalic, Mucus membranes moist.  No thrush Neck: Supple, No tracheal deviation Chest: No pain.  Good respiratory excursion. CV:  Pulses intact.  Regular rhythm MS: Normal AROM mjr joints.  No obvious deformity Abdomen: Soft, Nondistended.  Min TTP at extract incision only.  No incarcerated hernias. Ext:  SCDs BLE.  No significant edema.  No cyanosis Skin: No petechiae / purpura  Discharged Condition: good   Past Medical History  Diagnosis Date  . Seasonal allergies   . Pneumonia   . Headache     Migraines  . Procidentia of rectum 06/02/2013    Overview:  Full thickness; for rectoplexy in future due to cost/cla 05/2013     Past Surgical History  Procedure Laterality Date  . No past surgeries    . Proctoscopy N/A 06/06/2016    Procedure: RIGID PROCTOSCOPY;  Surgeon: Michael Boston, MD;  Location: [facility];  Service: General;  Laterality: N/A;    Social History   Social History  . Marital Status: Divorced    Spouse Name: N/A  . Number of Children: N/A  . Years of Education: N/A   Occupational History  . Not on file.   Social History Main Topics  . Smoking status: Former Smoker    Quit date: 06/01/2000  . Smokeless tobacco: Never Used  . Alcohol Use: Yes     Comment: occasionally  . Drug Use: No  . Sexual Activity: Not on file   Other Topics Concern  . Not on file   Social History Narrative    History reviewed. No pertinent family history.  Current Facility-Administered Medications  Medication Dose Route Frequency Provider Last Rate Last Dose  . 0.9 %  sodium chloride infusion  250 mL Intravenous PRN Michael Boston, MD      . acetaminophen (TYLENOL) tablet 1,000 mg  1,000 mg Oral TID Michael Boston, MD   1,000 mg at 06/07/16 2139  . albuterol (PROVENTIL) (2.5 MG/3ML) 0.083%  nebulizer solution 3 mL  3 mL Inhalation Q4H PRN Michael Boston, MD      . alum & mag hydroxide-simeth (MAALOX/MYLANTA) 200-200-20 MG/5ML suspension 30 mL  30 mL Oral Q6H PRN Michael Boston, MD      . alvimopan (ENTEREG) capsule 12 mg  12 mg Oral BID Michael Boston, MD   12 mg at 06/07/16 2139  . benzonatate (TESSALON) capsule 100 mg  100 mg Oral TID PRN Michael Boston, MD      . diphenhydrAMINE (  BENADRYL) 12.5 MG/5ML elixir 12.5 mg  12.5 mg Oral Q6H PRN Michael Boston, MD       Or  . diphenhydrAMINE (BENADRYL) injection 12.5 mg  12.5 mg Intravenous Q6H PRN Michael Boston, MD      . enoxaparin (LOVENOX) injection 40 mg  40 mg Subcutaneous Q24H Michael Boston, MD   40 mg at 06/07/16 0916  . HYDROmorphone (DILAUDID) injection 0.5-2 mg  0.5-2 mg Intravenous Q1H PRN Michael Boston, MD   1 mg at 06/08/16 0418  . lactated ringers bolus 1,000 mL  1,000 mL Intravenous Q8H PRN Michael Boston, MD      . lactated ringers bolus 1,000 mL  1,000 mL Intravenous Once Michael Boston, MD   1,000 mL at 06/07/16 0745  . lip balm (CARMEX) ointment 1 application  1 application Topical BID Michael Boston, MD   1 application at 69/67/89 2200  . loratadine (CLARITIN) tablet 10 mg  10 mg Oral Daily Michael Boston, MD   10 mg at 06/06/16 2240  . magic mouthwash  15 mL Oral QID PRN Michael Boston, MD      . menthol-cetylpyridinium (CEPACOL) lozenge 3 mg  1 lozenge Oral PRN Michael Boston, MD      . metoprolol (LOPRESSOR) injection 5 mg  5 mg Intravenous Q6H PRN Michael Boston, MD      . multivitamin with minerals tablet 1 tablet  1 tablet Oral Daily Michael Boston, MD   1 tablet at 06/06/16 2240  . oxyCODONE (Oxy IR/ROXICODONE) immediate release tablet 5-10 mg  5-10 mg Oral Q4H PRN Michael Boston, MD      . phenol [facility]) mouth spray 2 spray  2 spray Mouth/Throat PRN Michael Boston, MD      . prochlorperazine (COMPAZINE) injection 10 mg  10 mg Intravenous Q6H PRN Michael Boston, MD   10 mg at 06/06/16 1828  . saccharomyces boulardii (FLORASTOR) capsule  250 mg  250 mg Oral BID Michael Boston, MD   250 mg at 06/07/16 2139  . sodium chloride flush (NS) 0.9 % injection 3 mL  3 mL Intravenous Q12H Michael Boston, MD   3 mL at 06/07/16 2140  . sodium chloride flush (NS) 0.9 % injection 3 mL  3 mL Intravenous PRN Michael Boston, MD      . valACYclovir (VALTREX) tablet 500 mg  500 mg Oral Daily PRN Michael Boston, MD      . vitamin C (ASCORBIC ACID) tablet 500 mg  500 mg Oral BID Michael Boston, MD   500 mg at 06/07/16 2139     No Known Allergies  Disposition: Final discharge disposition not confirmed  Discharge Instructions    Call MD for:  extreme fatigue    Complete by:  As directed      Call MD for:  extreme fatigue    Complete by:  As directed      Call MD for:  hives    Complete by:  As directed      Call MD for:  hives    Complete by:  As directed      Call MD for:  persistant nausea and vomiting    Complete by:  As directed      Call MD for:  persistant nausea and vomiting    Complete by:  As directed      Call MD for:  redness, tenderness, or signs of infection (pain, swelling, redness, odor or green/yellow discharge around incision site)    Complete by:  As directed  Call MD for:  redness, tenderness, or signs of infection (pain, swelling, redness, odor or green/yellow discharge around incision site)    Complete by:  As directed      Call MD for:  severe uncontrolled pain    Complete by:  As directed      Call MD for:  severe uncontrolled pain    Complete by:  As directed      Call MD for:    Complete by:  As directed   Temperature > 101.70F     Call MD for:    Complete by:  As directed   Temperature > 101.70F     Diet - low sodium heart healthy    Complete by:  As directed   Start with bland, low residue diet for a few days, then advance to a heart healthy (low fat, high fiber) diet.  If you feel nauseated or constipated, simplify to a liquid only diet for 48 hours until you are feeling better (no more nausea, farting/passing gas,  having a bowel movement, etc...).  If you cannot tolerate even drinking liquids, or feeling worse, let your surgeon know or go to the Emergency Department for help.     Diet - low sodium heart healthy    Complete by:  As directed   Start with bland, low residue diet for a few days, then advance to a heart healthy (low fat, high fiber) diet.  If you feel nauseated or constipated, simplify to a liquid only diet for 48 hours until you are feeling better (no more nausea, farting/passing gas, having a bowel movement, etc...).  If you cannot tolerate even drinking liquids, or feeling worse, let your surgeon know or go to the Emergency Department for help.     Discharge instructions    Complete by:  As directed   Please see discharge instruction sheets.   Also refer to any handouts/printouts that may have been given from the [facility] surgery office (if you visited [place] there before surgery) Please call our office if you have any questions or concerns [phone]     Discharge instructions    Complete by:  As directed   Please see discharge instruction sheets.   Also refer to any handouts/printouts that may have been given from the [facility] surgery office (if you visited [place] there before surgery) Please call our office if you have any questions or concerns [phone]     Discharge wound care:    Complete by:  As directed   Yu have closed incisions: Shower and bathe over these incisions with soap and water every day.  It is OK to wash over the dressings: they are waterproof. Remove all surgical dressings on postoperative day #3 = Saturday day AM 6/17.  You do not need to replace dressings over the closed incisions unless you feel more comfortable with a Band-Aid covering it.   IPlease call our office [phone] if you have further questions.     Driving Restrictions    Complete by:  As directed   No driving until off narcotics and can safely swerve away without pain during an emergency     Driving  Restrictions    Complete by:  As directed   No driving until off narcotics and can safely swerve away without pain during an emergency     Increase activity slowly    Complete by:  As directed   Walk an hour a day.  Use 20-30 minute walks.  When  you can walk 30 minutes without difficulty, it is fine to restart low impact/moderate activities such as biking, jogging, swimming, sexual activity, etc.  Eventually you can increase to unrestricted activity when not feeling pain.  If you feel pain: STOP!Marland Kitchen   Let pain protect you from overdoing it.  Use ice/heat & over-the-counter pain medications to help minimize soreness.  If that is not enough, then use your narcotic pain prescription as needed to remain active.  It is better to take extra pain medications and be more active than to stay bedridden to avoid all pain medications.     Increase activity slowly    Complete by:  As directed   Walk an hour a day.  Use 20-30 minute walks.  When you can walk 30 minutes without difficulty, it is fine to restart low impact/moderate activities such as biking, jogging, swimming, sexual activity, etc.  Eventually you can increase to unrestricted activity when not feeling pain.  If you feel pain: STOP!Marland Kitchen   Let pain protect you from overdoing it.  Use ice/heat & over-the-counter pain medications to help minimize soreness.  If that is not enough, then use your narcotic pain prescription as needed to remain active.  It is better to take extra pain medications and be more active than to stay bedridden to avoid all pain medications.     Lifting restrictions    Complete by:  As directed   Avoid heavy lifting initially, <20 pounds at first.   Do not push through pain.   You have no specific weight limit: If it hurts to do, DON'T DO IT.    If you feel no pain, you are not injuring anything.  Pain will protect you from injury.   Coughing and sneezing are far more stressful to your incision than any lifting.   Avoid resuming heavy  lifting (>50 pounds) or other intense activity until off all narcotic pain medications.   When want to exercise more, give yourself 2 weeks to gradually get back to full intense exercise/activity.     Lifting restrictions    Complete by:  As directed   Avoid heavy lifting initially, <20 pounds at first.   Do not push through pain.   You have no specific weight limit: If it hurts to do, DON'T DO IT.    If you feel no pain, you are not injuring anything.  Pain will protect you from injury.   Coughing and sneezing are far more stressful to your incision than any lifting.   Avoid resuming heavy lifting (>50 pounds) or other intense activity until off all narcotic pain medications.   When want to exercise more, give yourself 2 weeks to gradually get back to full intense exercise/activity.     May shower / Bathe    Complete by:  As directed   [place].  It is fine for dressings or wounds to be washed/rinsed.  Use gentle soap & water.  This will help the incisions and/or wounds get clean & minimize infection.     May shower / Bathe    Complete by:  As directed   [place].  It is fine for dressings or wounds to be washed/rinsed.  Use gentle soap & water.  This will help the incisions and/or wounds get clean & minimize infection.     May walk up steps    Complete by:  As directed      May walk up steps    Complete by:  As directed      Sexual Activity Restrictions    Complete by:  As directed   Sexual activity as tolerated.  Do not push through pain.  Pain will protect you from injury.     Sexual Activity Restrictions    Complete by:  As directed   Sexual activity as tolerated.  Do not push through pain.  Pain will protect you from injury.     Walk with assistance    Complete by:  As directed   Walk over an hour a day.  May use a walker/cane/companion to help with balance and stamina.     Walk with assistance    Complete by:  As directed   Walk over an hour a day.  May use a  walker/cane/companion to help with balance and stamina.            Medication List    TAKE these medications        acetaminophen 500 MG tablet  Commonly known as:  TYLENOL  Take 1,000 mg by mouth every 6 (six) hours as needed.     benzonatate 100 MG capsule  Commonly known as:  TESSALON  Take 100 mg by mouth 3 (three) times daily as needed for cough.     cetirizine 10 MG tablet  Commonly known as:  ZYRTEC  Take 10 mg by mouth daily.     ibuprofen 200 MG tablet  Commonly known as:  ADVIL,MOTRIN  Take 400 mg by mouth every 6 (six) hours as needed (For pain.).     levonorgestrel 20 MCG/24HR IUD  Commonly known as:  MIRENA  1 each by Intrauterine route once.     multivitamin with minerals Tabs tablet  Take 1 tablet by mouth daily.     oxyCODONE 5 MG immediate release tablet  Commonly known as:  Oxy IR/ROXICODONE  Take 1-2 tablets (5-10 mg total) by mouth every 6 (six) hours as needed for moderate pain, severe pain or breakthrough pain.     valACYclovir 500 MG tablet  Commonly known as:  VALTREX  Take 500 mg by mouth daily as needed (For outbreaks.).     VENTOLIN HFA 108 (90 Base) MCG/ACT inhaler  Generic drug:  albuterol  Inhale 2 puffs into the lungs every 4 (four) hours as needed for wheezing.           Follow-up Information    Follow up with Issabella Rix C., MD. Schedule an appointment as soon as possible for a visit in 2 weeks.   Specialty:  General Surgery   Why:  To follow up after your operation, To follow up after your hospital stay   Contact information:   [address] [place] [address] [phone]        Signed: Morton Peters, M.D., F.A.C.S. Gastrointestinal and Minimally Invasive Surgery [facility] [address] [place], [place] [id number] [phone] Main / Paging   06/08/2016, 7:41 AM

## 2016-06-08 NOTE — Progress Notes (Signed)
[facility]  [address] [place], 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 We can pelvic floor and sphincter tone but no sphincter defect. Mild thickening of rectum but no active proctitis.  No obvious metastatic disease on visceral parietal peritoneum or liver.  The anastomosis rests 12 cm from the anal verge by rigid proctoscopy   Assessment  Recovering better  Plan:  -IVF weaned -ortho VS ok -adv solid diet -transition to PO pain control -bowel regimen -f/u pathology -allergy control -VTE prophylaxis- SCDs, etc -mobilize as tolerated to help recovery  D/C patient from hospital when patient meets criteria (anticipate later today):  Tolerating oral intake well Ambulating well Adequate pain control without IV medications Urinating  Having flatus Disposition planning in place  I updated the patient's status to the patient.  Recommendations were made.  Questions were answered.  She expressed understanding &  appreciation.   Adin Hector, M.D., F.A.C.S. Gastrointestinal and Minimally Invasive Surgery [facility] [address] [place], [place] [address] [phone] Main / Paging   06/08/2016  Subjective:  Feels better Pain with sitting up only Walking more Tol pureed - wants solids Loose BM last night  Objective:  Vital signs:  Filed Vitals:   06/07/16 2122 06/07/16 2201 06/08/16 0500 06/08/16 0558  BP: 108/73   97/63  Pulse: 56   76  Temp: 98.2 F (36.8 C)   99.1 F (37.3 C)  TempSrc: Oral   Oral  Resp: 15   14  Height:      Weight:  59.875 kg (132 lb) 64.2 kg (141 lb 8.6 oz)   SpO2: 93% 100%  96%    Last BM Date: 06/07/16  Intake/Output   Yesterday:  06/15 0701 - 06/16 0700 In: 720 [P.O.:720] Out: 3810 [Urine:3930; Stool:1] This shift:     Bowel function:  Flatus: No  BM:  No  Drain: (No drain)   Physical Exam:  General: Pt awake/alert/oriented x4 in No acute distress.  Chatty.  Inquisitive Eyes: PERRL, normal EOM.  Sclera clear.  No icterus Neuro: CN II-XII intact w/o focal sensory/motor deficits. Lymph: No head/neck/groin lymphadenopathy Psych:  No delerium/psychosis/paranoia HENT: Normocephalic, Mucus membranes moist.  No thrush Neck: Supple, No tracheal deviation Chest: No chest wall pain w good excursion CV:  Pulses intact.  Regular rhythm MS: Normal AROM mjr joints.  No obvious deformity Abdomen: Soft.  Nondistended.  Nontender.  No evidence of peritonitis.  No incarcerated hernias.  Dressings c/d/i. Ext:  SCDs BLE.  No mjr edema.  No cyanosis Skin: No petechiae / purpura  Results:   Labs: Results for orders placed or performed during the  hospital encounter of 06/06/16 (from the past 48 hour(s))  Pregnancy, urine     Status: None   Collection Time: 06/06/16 10:35 AM  Result Value Ref Range   Preg Test, Ur NEGATIVE NEGATIVE    Comment:        THE SENSITIVITY OF THIS METHODOLOGY IS >20 mIU/mL.    Basic metabolic panel     Status: Abnormal   Collection Time: 06/07/16  4:39 AM  Result Value Ref Range   Sodium 138 135 - 145 mmol/L   Potassium 3.5 3.5 - 5.1 mmol/L   Chloride 106 101 - 111 mmol/L   CO2 25 22 - 32 mmol/L   Glucose, Bld 91 65 - 99 mg/dL   BUN 6 6 - 20 mg/dL   Creatinine, Ser 0.58 0.44 - 1.00 mg/dL   Calcium 8.7 (L) 8.9 - 10.3 mg/dL   GFR calc non Af Amer >60 >60 mL/min   GFR calc Af Amer >60 >60 mL/min    Comment: (NOTE) The eGFR has been calculated using the CKD EPI equation. This calculation has not been validated in all clinical situations. eGFR's persistently <60 mL/min signify possible Chronic Kidney Disease.    Anion gap 7 5 - 15  CBC     Status: Abnormal   Collection Time: 06/07/16  4:39 AM  Result Value Ref Range   WBC 9.7 4.0 - 10.5 K/uL   RBC 3.66 (L) 3.87 - 5.11 MIL/uL   Hemoglobin 11.8 (L) 12.0 - 15.0 g/dL   HCT 34.2 (L) 36.0 - 46.0 %   MCV 93.4 78.0 - 100.0 fL   MCH 32.2 26.0 - 34.0 pg   MCHC 34.5 30.0 - 36.0 g/dL   RDW 12.8 11.5 - 15.5 %   Platelets 284 150 - 400 K/uL  Magnesium     Status: None   Collection Time: 06/07/16  4:39 AM  Result Value Ref Range   Magnesium 1.8 1.7 - 2.4 mg/dL  Hemoglobin     Status: None   Collection Time: 06/08/16  4:06 AM  Result Value Ref Range   Hemoglobin 12.5 12.0 - 15.0 g/dL  Potassium     Status: None   Collection Time: 06/08/16  4:06 AM  Result Value Ref Range   Potassium 3.8 3.5 - 5.1 mmol/L  Creatinine, serum     Status: None   Collection Time: 06/08/16  4:06 AM  Result Value Ref Range   Creatinine, Ser 0.57 0.44 - 1.00 mg/dL   GFR calc non Af Amer >60 >60 mL/min   GFR calc Af Amer >60 >60 mL/min    Comment: (NOTE) The eGFR has been calculated using the CKD EPI equation. This calculation has not been validated in all clinical situations. eGFR's persistently <60 mL/min signify possible Chronic Kidney Disease.     Imaging / Studies: No results found.  Medications / Allergies: per  chart  Antibiotics: Anti-infectives    Start     Dose/Rate Route Frequency Ordered Stop   06/06/16 2300  cefoTEtan (CEFOTAN) 2 g in dextrose 5 % 50 mL IVPB     2 g 100 mL/hr over 30 Minutes Intravenous Every 12 hours 06/06/16 1742 06/06/16 2307   06/06/16 1742  valACYclovir (VALTREX) tablet 500 mg     500 mg Oral Daily PRN 06/06/16 1742     06/06/16 1320  clindamycin (CLEOCIN) 900 mg, gentamicin (GARAMYCIN) 240 mg in sodium chloride 0.9 % 1,000 mL for intraperitoneal lavage  Status:  Discontinued  As needed 06/06/16 1320 06/06/16 1601   06/06/16 1020  cefoTEtan (CEFOTAN) 2 g in dextrose 5 % 50 mL IVPB     2 g 100 mL/hr over 30 Minutes Intravenous On call to O.R. 06/06/16 1020 06/06/16 1230   06/06/16 0600  clindamycin (CLEOCIN) 900 mg, gentamicin (GARAMYCIN) 240 mg in sodium chloride 0.9 % 1,000 mL for intraperitoneal lavage  Status:  Discontinued    Comments:  Pharmacy may adjust dosing strength, schedule, rate of infusion, etc as needed to optimize therapy    Intraperitoneal To Surgery 06/05/16 1340 06/06/16 1732        Note: Portions of this report may have been transcribed using voice recognition software. Every effort was made to ensure accuracy; however, inadvertent computerized transcription errors may be present.   Any transcriptional errors that result from this process are unintentional.     Adin Hector, M.D., F.A.C.S. Gastrointestinal and Minimally Invasive Surgery [facility] [address], [place], [place] [address] [phone] Main / Paging   06/08/2016

## 2016-06-08 NOTE — Progress Notes (Signed)
Patient with complaints of pain after eating her breakfast, will notify Earlyne Iba RN 12:10 PM 06-08-2016

## 2016-06-09 MED ORDER — ONDANSETRON HCL 4 MG PO TABS: 4.0000 mg | ORAL_TABLET | Freq: Three times a day (TID) | ORAL | Status: DC | PRN

## 2016-06-09 NOTE — Discharge Summary (Signed)
Physician Discharge Summary  Patient ID: Susan Meyers MRN: [id number] DOB/AGE: 1976-03-19 40 y.o.  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  Started on diet and began passing flatus and BMs.  No prolapse.  Doing well and ready for discharge on Saturday.    Consults: none  Significant Diagnostic Studies: none    Discharge Exam: Blood pressure 115/76, pulse 70, temperature 98.4 F (36.9 C), temperature source Oral, resp. rate 16, height 5\' 4"  (1.626 m), weight 62.5 kg (137 lb 12.6 oz), SpO2 98 %. Incisions healing OK.    Disposition: Final discharge disposition not confirmed  Discharge Instructions    Call MD for:  extreme fatigue    Complete by:  As directed      Call MD for:  extreme fatigue    Complete by:  As directed      Call MD for:  hives    Complete by:  As directed      Call MD for:  hives    Complete by:  As directed      Call MD for:  persistant nausea and vomiting    Complete by:  As directed      Call MD for:  persistant nausea and vomiting    Complete by:  As directed      Call MD for:  redness, tenderness, or signs of infection (pain, swelling, redness, odor or green/yellow discharge around incision site)    Complete by:  As directed      Call MD for:  redness, tenderness, or signs of infection (pain, swelling, redness, odor or green/yellow discharge around incision site)    Complete by:  As directed      Call MD for:  severe uncontrolled pain    Complete by:  As directed      Call MD for:  severe uncontrolled pain    Complete by:  As directed      Call MD for:    Complete by:  As directed   Temperature > 101.64F     Call MD for:     Complete by:  As directed   Temperature > 101.64F     Diet - low sodium heart healthy    Complete by:  As directed   Start with bland, low residue diet for a few days, then advance to a heart healthy (low fat, high fiber) diet.  If you feel nauseated or constipated, simplify to a liquid only diet for 48 hours until you are feeling better (no more nausea, farting/passing gas, having a bowel movement, etc...).  If you cannot tolerate even drinking liquids, or feeling worse, let your surgeon know or go to the Emergency Department for help.     Diet - low sodium heart healthy    Complete by:  As directed   Start with bland, low residue diet for a few days, then advance to a heart healthy (low fat, high fiber) diet.  If you feel nauseated or constipated, simplify to a liquid only diet for 48 hours until you are feeling better (no more nausea, farting/passing gas, having a bowel movement, etc...).  If you cannot tolerate even drinking liquids, or feeling worse, let your surgeon  know or go to the Emergency Department for help.     Diet - low sodium heart healthy    Complete by:  As directed      Discharge instructions    Complete by:  As directed   Please see discharge instruction sheets.   Also refer to any handouts/printouts that may have been given from the [facility] surgery office (if you visited [place] there before surgery) Please call our office if you have any questions or concerns [phone]     Discharge instructions    Complete by:  As directed   Please see discharge instruction sheets.   Also refer to any handouts/printouts that may have been given from the [facility] surgery office (if you visited [place] there before surgery) Please call our office if you have any questions or concerns [phone]     Discharge wound care:    Complete by:  As directed   Yu have closed incisions: Shower and bathe over these incisions with soap and water every day.  It is OK to wash over the dressings: they are  waterproof. Remove all surgical dressings on postoperative day #3 = Saturday day AM 6/17.  You do not need to replace dressings over the closed incisions unless you feel more comfortable with a Band-Aid covering it.   IPlease call our office [phone] if you have further questions.     Driving Restrictions    Complete by:  As directed   No driving until off narcotics and can safely swerve away without pain during an emergency     Driving Restrictions    Complete by:  As directed   No driving until off narcotics and can safely swerve away without pain during an emergency     Increase activity slowly    Complete by:  As directed   Walk an hour a day.  Use 20-30 minute walks.  When you can walk 30 minutes without difficulty, it is fine to restart low impact/moderate activities such as biking, jogging, swimming, sexual activity, etc.  Eventually you can increase to unrestricted activity when not feeling pain.  If you feel pain: STOP!Marland Kitchen   Let pain protect you from overdoing it.  Use ice/heat & over-the-counter pain medications to help minimize soreness.  If that is not enough, then use your narcotic pain prescription as needed to remain active.  It is better to take extra pain medications and be more active than to stay bedridden to avoid all pain medications.     Increase activity slowly    Complete by:  As directed   Walk an hour a day.  Use 20-30 minute walks.  When you can walk 30 minutes without difficulty, it is fine to restart low impact/moderate activities such as biking, jogging, swimming, sexual activity, etc.  Eventually you can increase to unrestricted activity when not feeling pain.  If you feel pain: STOP!Marland Kitchen   Let pain protect you from overdoing it.  Use ice/heat & over-the-counter pain medications to help minimize soreness.  If that is not enough, then use your narcotic pain prescription as needed to remain active.  It is better to take extra pain medications and be more active than to stay  bedridden to avoid all pain medications.     Increase activity slowly    Complete by:  As directed      Lifting restrictions    Complete by:  As directed   Avoid heavy lifting initially, <20 pounds at first.  Do not push through pain.   You have no specific weight limit: If it hurts to do, DON'T DO IT.    If you feel no pain, you are not injuring anything.  Pain will protect you from injury.   Coughing and sneezing are far more stressful to your incision than any lifting.   Avoid resuming heavy lifting (>50 pounds) or other intense activity until off all narcotic pain medications.   When want to exercise more, give yourself 2 weeks to gradually get back to full intense exercise/activity.     Lifting restrictions    Complete by:  As directed   Avoid heavy lifting initially, <20 pounds at first.   Do not push through pain.   You have no specific weight limit: If it hurts to do, DON'T DO IT.    If you feel no pain, you are not injuring anything.  Pain will protect you from injury.   Coughing and sneezing are far more stressful to your incision than any lifting.   Avoid resuming heavy lifting (>50 pounds) or other intense activity until off all narcotic pain medications.   When want to exercise more, give yourself 2 weeks to gradually get back to full intense exercise/activity.     May shower / Bathe    Complete by:  As directed   [place].  It is fine for dressings or wounds to be washed/rinsed.  Use gentle soap & water.  This will help the incisions and/or wounds get clean & minimize infection.     May shower / Bathe    Complete by:  As directed   [place].  It is fine for dressings or wounds to be washed/rinsed.  Use gentle soap & water.  This will help the incisions and/or wounds get clean & minimize infection.     May walk up steps    Complete by:  As directed      May walk up steps    Complete by:  As directed      Sexual Activity Restrictions    Complete by:  As  directed   Sexual activity as tolerated.  Do not push through pain.  Pain will protect you from injury.     Sexual Activity Restrictions    Complete by:  As directed   Sexual activity as tolerated.  Do not push through pain.  Pain will protect you from injury.     Walk with assistance    Complete by:  As directed   Walk over an hour a day.  May use a walker/cane/companion to help with balance and stamina.     Walk with assistance    Complete by:  As directed   Walk over an hour a day.  May use a walker/cane/companion to help with balance and stamina.            Medication List    TAKE these medications        acetaminophen 500 MG tablet  Commonly known as:  TYLENOL  Take 1,000 mg by mouth every 6 (six) hours as needed.     benzonatate 100 MG capsule  Commonly known as:  TESSALON  Take 100 mg by mouth 3 (three) times daily as needed for cough.     cetirizine 10 MG tablet  Commonly known as:  ZYRTEC  Take 10 mg by mouth daily.     ibuprofen 200 MG tablet  Commonly known as:  ADVIL,MOTRIN  Take 400 mg  by mouth every 6 (six) hours as needed (For pain.).     levonorgestrel 20 MCG/24HR IUD  Commonly known as:  MIRENA  1 each by Intrauterine route once.     multivitamin with minerals Tabs tablet  Take 1 tablet by mouth daily.     oxyCODONE 5 MG immediate release tablet  Commonly known as:  Oxy IR/ROXICODONE  Take 1-2 tablets (5-10 mg total) by mouth every 6 (six) hours as needed for moderate pain, severe pain or breakthrough pain.     valACYclovir 500 MG tablet  Commonly known as:  VALTREX  Take 500 mg by mouth daily as needed (For outbreaks.).     VENTOLIN HFA 108 (90 Base) MCG/ACT inhaler  Generic drug:  albuterol  Inhale 2 puffs into the lungs every 4 (four) hours as needed for wheezing.           Follow-up Information    Follow up with GROSS,STEVEN C., MD. Schedule an appointment as soon as possible for a visit in 2 weeks.   Specialty:  General Surgery    Why:  To follow up after your operation, To follow up after your hospital stay   Contact information:   [address] [place] [address] [phone]       Signed: Pedro Earls 06/09/2016, 9:00 AM

## 2016-06-09 NOTE — Progress Notes (Signed)
Discharge discussed with Pt . Able to answer questions about when to call MD., and medications at home.

## 2016-06-15 ENCOUNTER — Emergency Department (HOSPITAL_COMMUNITY): Payer: BLUE CROSS/BLUE SHIELD

## 2016-06-15 ENCOUNTER — Encounter (HOSPITAL_COMMUNITY): Payer: Self-pay | Admitting: Emergency Medicine

## 2016-06-15 ENCOUNTER — Emergency Department (HOSPITAL_COMMUNITY)
Admission: EM | Admit: 2016-06-15 | Discharge: 2016-06-16 | Disposition: A | Discharge status: Home / Self Care | Payer: BLUE CROSS/BLUE SHIELD | Attending: Emergency Medicine | Admitting: Emergency Medicine

## 2016-06-15 DIAGNOSIS — Z79899 Other long term (current) drug therapy: Secondary | ICD-10-CM | POA: Diagnosis not present

## 2016-06-15 DIAGNOSIS — Z791 Long term (current) use of non-steroidal anti-inflammatories (NSAID): Secondary | ICD-10-CM | POA: Insufficient documentation

## 2016-06-15 DIAGNOSIS — K5900 Constipation, unspecified: Secondary | ICD-10-CM | POA: Insufficient documentation

## 2016-06-15 DIAGNOSIS — R1084 Generalized abdominal pain: Secondary | ICD-10-CM | POA: Diagnosis present

## 2016-06-15 DIAGNOSIS — Z87891 Personal history of nicotine dependence: Secondary | ICD-10-CM | POA: Insufficient documentation

## 2016-06-15 DIAGNOSIS — R1110 Vomiting, unspecified: Secondary | ICD-10-CM | POA: Diagnosis not present

## 2016-06-15 LAB — CBC
HEMATOCRIT: 40.7 % (ref 36.0–46.0)
HEMOGLOBIN: 14.4 g/dL (ref 12.0–15.0)
MCH: 31.8 pg (ref 26.0–34.0)
MCHC: 35.4 g/dL (ref 30.0–36.0)
MCV: 89.8 fL (ref 78.0–100.0)
Platelets: 381 10*3/uL (ref 150–400)
RBC: 4.53 MIL/uL (ref 3.87–5.11)
RDW: 12.7 % (ref 11.5–15.5)
WBC: 10 10*3/uL (ref 4.0–10.5)

## 2016-06-15 LAB — COMPREHENSIVE METABOLIC PANEL
ALK PHOS: 42 U/L (ref 38–126)
ALT: 43 U/L (ref 14–54)
ANION GAP: 10 (ref 5–15)
AST: 26 U/L (ref 15–41)
Albumin: 4.6 g/dL (ref 3.5–5.0)
BUN: 6 mg/dL (ref 6–20)
CHLORIDE: 100 mmol/L — AB (ref 101–111)
CO2: 22 mmol/L (ref 22–32)
Calcium: 9.9 mg/dL (ref 8.9–10.3)
Creatinine, Ser: 0.74 mg/dL (ref 0.44–1.00)
GLUCOSE: 124 mg/dL — AB (ref 65–99)
Potassium: 3.6 mmol/L (ref 3.5–5.1)
SODIUM: 132 mmol/L — AB (ref 135–145)
Total Bilirubin: 1.1 mg/dL (ref 0.3–1.2)
Total Protein: 8 g/dL (ref 6.5–8.1)

## 2016-06-15 LAB — URINALYSIS, ROUTINE W REFLEX MICROSCOPIC
BILIRUBIN URINE: NEGATIVE
Glucose, UA: NEGATIVE mg/dL
HGB URINE DIPSTICK: NEGATIVE
Ketones, ur: >80 mg/dL — AB
Leukocytes, UA: NEGATIVE
NITRITE: NEGATIVE
PH: 8.5 — AB (ref 5.0–8.0)
Protein, ur: NEGATIVE mg/dL
SPECIFIC GRAVITY, URINE: 1.015 (ref 1.005–1.030)

## 2016-06-15 LAB — LIPASE, BLOOD: LIPASE: 29 U/L (ref 11–51)

## 2016-06-15 MED ORDER — SODIUM CHLORIDE 0.9 % IV BOLUS (SEPSIS)
1000.0000 mL | Freq: Once | INTRAVENOUS | Status: AC
  Administered 2016-06-15: 1000 mL via INTRAVENOUS

## 2016-06-15 MED ORDER — ONDANSETRON HCL 4 MG/2ML IJ SOLN
4.0000 mg | Freq: Once | INTRAMUSCULAR | Status: AC
  Administered 2016-06-15: 4 mg via INTRAVENOUS
  Filled 2016-06-15: qty 2

## 2016-06-15 MED ORDER — ONDANSETRON 4 MG PO TBDP
4.0000 mg | ORAL_TABLET | Freq: Once | ORAL | Status: AC | PRN
  Administered 2016-06-15: 4 mg via ORAL
  Filled 2016-06-15: qty 1

## 2016-06-15 MED ORDER — MORPHINE SULFATE (PF) 4 MG/ML IV SOLN
4.0000 mg | Freq: Once | INTRAVENOUS | Status: DC
  Filled 2016-06-15: qty 1

## 2016-06-15 MED ORDER — IOPAMIDOL (ISOVUE-300) INJECTION 61%
100.0000 mL | Freq: Once | INTRAVENOUS | Status: AC | PRN
  Administered 2016-06-16: 100 mL via INTRAVENOUS

## 2016-06-15 MED ORDER — MORPHINE SULFATE (PF) 2 MG/ML IV SOLN
2.0000 mg | Freq: Once | INTRAVENOUS | Status: AC
  Administered 2016-06-15: 2 mg via INTRAVENOUS

## 2016-06-15 NOTE — ED Notes (Signed)
Pt c/o 9 episodes of emesis in the past 24 hours. Pt had colectomy on 6/14 and has been nauseous since. Pt reports taking zofran at 1600 with no relief.

## 2016-06-15 NOTE — ED Notes (Signed)
PA at bedside.

## 2016-06-15 NOTE — ED Notes (Signed)
Pt in X-Ray ?

## 2016-06-15 NOTE — ED Provider Notes (Signed)
CSN: [id number]     Arrival date & time 06/15/16  1914 History   First MD Initiated Contact with Patient 06/15/16 2146     Chief Complaint  Patient presents with  . Emesis  . 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  Patient states she was discharged from the hospital on postop day 3 after being able tolerate soft foods and had soft bowel movement, at home she had Mild intermittent abdominal pain and occasional vomiting with some worsening constipation a few days after arriving home. She used a stool softener (dolcusate) and "cleaned herself out" 4 days ago, but then again became constipated.   Two days ago she took milk of magnesia, and had small stool yesterday, but today has not passed any gas and has not passed any more stool.  She does complain of loss of appetite, nausea, hot and cold chills, low-grade temperature. She came to the ER after having worsening, severe and profuse vomiting over the past two days, unable to tolerate liquids.  She attempted to take pain medicine but vomited it back up.  After being constipated she avoiding using narcotics and only used Tylenol to manage pain. She cannot count the # of vomiting episodes, emesis is non-bloody, non-bilious, mostly clear liquids appearing similar to saliva.   She did call surgeon's office, who encouraged her to come to the ER for evaluation.  Her abdominal pain is located in her right lower quadrant near one of the surgical incisions.  Pain is described as cramping and squeezing, rated 7/10, severe, worse with palpation and movement. All her incisions are reportedly healing well, appear  intact, without swelling, drainage or redness. She denies urinary symptoms, vaginal symptoms, back pain, cough, sweats.  Has intermittent hot and cold chills, she suspects "low grade fever" but has been taking tylenol so she "is not sure."  No other acute or associated sx.  Past Medical History  Diagnosis Date  . Seasonal allergies   . Pneumonia   . Headache     Migraines  . 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 No past surgeries    . Proctoscopy N/A 06/06/2016    Procedure: RIGID PROCTOSCOPY;  Surgeon: Michael Boston, MD;  Location: [facility];  Service: General;  Laterality: N/A;  . Colon surgery     History reviewed. No pertinent family history. Social History  Substance Use Topics  . Smoking status: Former Smoker    Quit date: 06/01/2000  . Smokeless tobacco: Never Used  . Alcohol Use: Yes     Comment: occasionally   OB History    No data available     Review of Systems  All other systems reviewed and are negative.     Allergies  Review of patient's allergies indicates no known allergies.  Home Medications   Prior to Admission medications   Medication Sig Start Date End Date Taking? Authorizing Provider  acetaminophen (TYLENOL) 500 MG tablet Take 1,000 mg by mouth every 6 (six) hours as needed for moderate pain.    Yes Historical Provider, MD  cetirizine (ZYRTEC) 10 MG  tablet Take 10 mg by mouth daily.   Yes Historical Provider, MD  naproxen sodium (ANAPROX) 220 MG tablet Take 440 mg by mouth 2 (two) times daily as needed (pain).   Yes Historical Provider, MD  ondansetron (ZOFRAN) 4 MG tablet Take 1 tablet (4 mg total) by mouth every 8 (eight) hours as needed for nausea. 06/09/16  Yes Johnathan Hausen, MD  oxyCODONE (OXY IR/ROXICODONE) 5 MG immediate release tablet Take 1-2 tablets (5-10 mg total) by mouth every 6 (six) hours as needed for moderate  pain, severe pain or breakthrough pain. 06/06/16  Yes Michael Boston, MD  benzonatate (TESSALON) 100 MG capsule Take 100 mg by mouth 3 (three) times daily as needed for cough.    Historical Provider, MD  ibuprofen (ADVIL,MOTRIN) 200 MG tablet Take 400 mg by mouth every 6 (six) hours as needed (For pain.).     Historical Provider, MD  levonorgestrel (MIRENA) 20 MCG/24HR IUD 1 each by Intrauterine route once.     Historical Provider, MD  ondansetron (ZOFRAN ODT) 4 MG disintegrating tablet Take 1 tablet (4 mg total) by mouth every 8 (eight) hours as needed for nausea. 06/16/16   Delsa Grana, PA-C  polyethylene glycol powder (GLYCOLAX/MIRALAX) powder Take 17 g by mouth daily. 06/16/16   Delsa Grana, PA-C  progesterone (PROMETRIUM) 200 MG capsule Take 200 mg by mouth at bedtime. 05/30/16   Historical Provider, MD  valACYclovir (VALTREX) 500 MG tablet Take 500 mg by mouth daily as needed (For outbreaks.).  05/14/16   Historical Provider, MD  VENTOLIN HFA 108 (90 Base) MCG/ACT inhaler Inhale 2 puffs into the lungs every 4 (four) hours as needed for wheezing.  03/29/16   Historical Provider, MD   BP 108/74 mmHg  Pulse 79  Temp(Src) 99.1 F (37.3 C) (Oral)  Resp 16  SpO2 97%  LMP 03/07/2016 (Approximate) Physical Exam  Constitutional: She is oriented to person, place, and time. She appears well-developed and well-nourished. No distress.  HENT:  Head: Normocephalic and atraumatic.  Nose: Nose normal.  Mouth/Throat: No oropharyngeal exudate.  Oral mucosa dry  Eyes: Conjunctivae and EOM are normal. Pupils are equal, round, and reactive to light. Right eye exhibits no discharge. Left eye exhibits no discharge. No scleral icterus.  Neck: Normal range of motion. No JVD present. No tracheal deviation present. No thyromegaly present.  Cardiovascular: Normal rate, regular rhythm, normal heart sounds and intact distal pulses.  Exam reveals no gallop and no friction rub.   No murmur heard. Pulmonary/Chest: Effort  normal and breath sounds normal. No respiratory distress. She has no wheezes. She has no rales. She exhibits no tenderness.  Abdominal: Soft. Normal appearance. She exhibits no distension and no mass. Bowel sounds are decreased. There is tenderness in the right lower quadrant and left lower quadrant. There is guarding. There is no rigidity, no rebound and no CVA tenderness.    Multiple laparoscopic surgical incisions, all appear clean dry and intact, incision to right lower quadrant has mild ecchymosis surrounding it, no induration or erythema of the abdominal wall, no masses palpated No suprapubic tenderness but right and left lower quadrant tenderness to light palpation with guarding  Musculoskeletal: Normal range of motion. She exhibits no edema or tenderness.  Lymphadenopathy:    She has no cervical adenopathy.  Neurological: She is alert and oriented to person, place, and time. She has normal reflexes. No cranial nerve deficit. She exhibits normal muscle tone. Coordination normal.  Skin: Skin is warm and dry. No rash noted.  She is not diaphoretic. No erythema. No pallor.  Psychiatric: She has a normal mood and affect. Her behavior is normal. Judgment and thought content normal.  Nursing note and vitals reviewed.   ED Course  Procedures (including critical care time) Labs Review Labs Reviewed  COMPREHENSIVE METABOLIC PANEL - Abnormal; Notable for the following:    Sodium 132 (*)    Chloride 100 (*)    Glucose, Bld 124 (*)    All other components within normal limits  URINALYSIS, ROUTINE W REFLEX MICROSCOPIC (NOT AT [facility]) - Abnormal; Notable for the following:    pH 8.5 (*)    Ketones, ur >80 (*)    All other components within normal limits  LIPASE, BLOOD  CBC    Imaging Review Ct Abdomen Pelvis W Contrast  06/16/2016  CLINICAL DATA:  40 year old female with abdominal pain, nausea vomiting. Status post partial colon resection 9 days ago. EXAM: CT ABDOMEN AND PELVIS WITH CONTRAST  TECHNIQUE: Multidetector CT imaging of the abdomen and pelvis was performed using the standard protocol following bolus administration of intravenous contrast. CONTRAST:  133mL ISOVUE-300 IOPAMIDOL (ISOVUE-300) INJECTION 61% COMPARISON:  Abdominal radiograph dated 06/15/2016 FINDINGS: The visualized lung bases are clear. No intra-abdominal free air. Small free fluid within the posterior pelvis. There is apparent mild enhancement of the borders of the fluid in the posterior pelvis, and therefore superimposed infection is not excluded. Clinical correlation is recommended. The liver, gallbladder, pancreas, spleen, adrenal glands, kidneys, visualized ureters, and urinary bladder appear unremarkable. The uterus and. An intrauterine device is noted. There are bilateral ovarian follicles and measuring up to 2.5 cm on the right. Evaluation of the bowel is limited in the absence of oral contrast. There is postsurgical changes of partial sigmoid colon resection with anastomotic suture. There is thickened appearance of the rectosigmoid, likely related to recent surgery. Large amount of stool noted in the ascending, and in the transverse colon. The splenic flexure of the colon extends upward and positioned anteriorly and to the left of the greater curvature of the stomach. There is associated mass effect and compression of the stomach. The descending and sigmoid colon are collapsed. There is no evidence of obstruction or twisting of the splenic flexure. There is twisting of the mesentery in the lower abdomen of superior to the surgery, likely postsurgical. There is no dilatation of the small bowel or evidence of obstruction. Normal appendix The abdominal aorta and IVC appear unremarkable. No portal venous gas identified. There is no adenopathy. The abdominal wall soft tissues appear unremarkable. Bilateral L5 pars defects. No listhesis. No acute fracture. IMPRESSION: Postsurgical changes of partial sigmoid colon resection.  Thickening of the rectosigmoid related to recent surgery. No large amount of stool in the ascending and transverse colon. The descending and sigmoid colon are collapsed. No definite obstruction or twisting identified in the splenic flexure. There is no evidence of small-bowel obstruction. Normal appendix. Small free fluid within the posterior pelvis. Mild enhancement of the borders of this fluid may be reactive, however superimposed infection is not excluded. Clinical correlation is recommended. Bilateral ovarian follicles and cysts. Electronically Signed   By: Anner Crete M.D.   On: 06/16/2016 01:10   Dg Abd Acute W/chest  06/15/2016  CLINICAL DATA:  40 year old with vomiting EXAM: DG ABDOMEN ACUTE W/ 1V CHEST COMPARISON:  None. FINDINGS: The lungs are clear. There is no pleural effusion or pneumothorax. The cardiac silhouette is within normal limits. Constipation. No bowel dilatation or evidence of obstruction. No radiopaque calculi  noted. There is no free air. The soft tissues and the osseous structures appear unremarkable. An IUD device noted within pelvis. IMPRESSION: Constipation.  No bowel obstruction. No acute cardiopulmonary process. Electronically Signed   By: Anner Crete M.D.   On: 06/15/2016 22:26   I have personally reviewed and evaluated these images and lab results as part of my medical decision-making.   EKG Interpretation None      MDM   40 year old female with worsening abdominal pain with constipation, nausea and vomiting status post sigmoid colectomy 9 days ago, symptoms were intermittent and mild but acutely worsening 2 days ago, unable to tolerate anything by mouth.  She contacted the surgeon's office who suggested she come to the ER for evaluation. Surgeries performed by Dr. Johney Maine.  Upon arrival to the ER she is actively vomiting, appears mildly dehydrated but otherwise nontoxic in appearance, vitals are hemodynamically stable, afebrile.  Basic lab work was ordered  with 2 L normal saline. She was given antiemetics and low dose pain medication. Vomiting stopped with medications.  On exam she was most tender in right and left lower quadrants, abd soft and non-distended, did not suspect obstruction.  Plain films were first obtained which showed constipation and no bowel obstruction. CT scan of the abdomen was also obtained which showed normal postsurgical changes and sigmoid colon resection, large amount of stool in the ascending and transverse colon, note definite obstruction or twisting identified, normal appendix, small amount of free fluid posterior pelvis, bilateral ovarian follicles and cyst.  Gen. surgery consulted for recommendations.  Dr. Zella Richer reviewed the pt's results and CT scan and did not find any evidence of obstruction but stated vomiting secondary to severe constipation.  He requested a third liter of fluids and additional dose of Zofran be given, after which patient should have a fluid challenge, if she is able to tolerate fluids she can discharge home with liquid diet, avoiding narcotics, and MiraLAX.  If she is unable to tolerate fluids he requested  Temp admission orders be put in for the surgical team to evaluate in the morning.  The pt was able to tolerate fluids.  She did have some persistent nausea, was given reglan, Benadryl and Bentyl. She was encouraged to avoid any laxatives, encouraged to slowly increase amount of liquids and take MiraLAX, narcotics to be avoided if possible.  She was in agreement with plan. She will follow-up with surgery.  Return precautions reviewed.  GI follow-up provided for outpatient treatment in the future if severe constipation.  She was discharged home with her mother, vital signs stable, symptoms improved. Filed Vitals:   06/16/16 0230 06/16/16 0300 06/16/16 0430 06/16/16 0530  BP: 115/80 116/81 118/78 108/74  Pulse: 68 86 68 79  Temp:      TempSrc:      Resp:      SpO2: 97% 95% 97% 97%     Final  diagnoses:  Vomiting  Constipation, unspecified constipation type  Generalized abdominal pain      Delsa Grana, PA-C 06/16/16 2131  Davonna Belling, MD 06/18/16 [phone]

## 2016-06-16 MED ORDER — DIPHENHYDRAMINE HCL 50 MG/ML IJ SOLN
25.0000 mg | Freq: Once | INTRAMUSCULAR | Status: AC
  Administered 2016-06-16: 25 mg via INTRAVENOUS
  Filled 2016-06-16: qty 1

## 2016-06-16 MED ORDER — SODIUM CHLORIDE 0.9 % IV BOLUS (SEPSIS)
1000.0000 mL | Freq: Once | INTRAVENOUS | Status: AC
  Administered 2016-06-16: 1000 mL via INTRAVENOUS

## 2016-06-16 MED ORDER — ONDANSETRON HCL 4 MG/2ML IJ SOLN
4.0000 mg | Freq: Once | INTRAMUSCULAR | Status: AC
  Administered 2016-06-16: 4 mg via INTRAVENOUS
  Filled 2016-06-16: qty 2

## 2016-06-16 MED ORDER — POLYETHYLENE GLYCOL 3350 17 GM/SCOOP PO POWD: 17.0000 g | Freq: Every day | ORAL | Status: DC

## 2016-06-16 MED ORDER — ONDANSETRON 4 MG PO TBDP: 4.0000 mg | ORAL_TABLET | Freq: Three times a day (TID) | ORAL | Status: DC | PRN

## 2016-06-16 MED ORDER — METOCLOPRAMIDE HCL 5 MG/ML IJ SOLN
10.0000 mg | INTRAMUSCULAR | Status: AC
  Administered 2016-06-16: 10 mg via INTRAVENOUS
  Filled 2016-06-16: qty 2

## 2016-06-16 MED ORDER — DICYCLOMINE HCL 10 MG PO CAPS
10.0000 mg | ORAL_CAPSULE | Freq: Once | ORAL | Status: AC
  Administered 2016-06-16: 10 mg via ORAL
  Filled 2016-06-16: qty 1

## 2016-06-16 NOTE — ED Notes (Signed)
Fluids provided to pt. 

## 2016-06-16 NOTE — Discharge Instructions (Signed)
Constipation, Adult °Constipation is when a person has fewer than three bowel movements a week, has difficulty having a bowel movement, or has stools that are dry, hard, or larger than normal. As people grow older, constipation is more common. A low-fiber diet, not taking in enough fluids, and taking certain medicines may make constipation worse.  °CAUSES  °· Certain medicines, such as antidepressants, pain medicine, iron supplements, antacids, and water pills.   °· Certain diseases, such as diabetes, irritable bowel syndrome (IBS), thyroid disease, or depression.   °· Not drinking enough water.   °· Not eating enough fiber-rich foods.   °· Stress or travel.   °· Lack of physical activity or exercise.   °· Ignoring the urge to have a bowel movement.   °· Using laxatives too much.   °SIGNS AND SYMPTOMS  °· Having fewer than three bowel movements a week.   °· Straining to have a bowel movement.   °· Having stools that are hard, dry, or larger than normal.   °· Feeling full or bloated.   °· Pain in the lower abdomen.   °· Not feeling relief after having a bowel movement.   °DIAGNOSIS  °Your health care provider will take a medical history and perform a physical exam. Further testing may be done for severe constipation. Some tests may include: °· A barium enema X-ray to examine your rectum, colon, and, sometimes, your small intestine.   °· A sigmoidoscopy to examine your lower colon.   °· A colonoscopy to examine your entire colon. °TREATMENT  °Treatment will depend on the severity of your constipation and what is causing it. Some dietary treatments include drinking more fluids and eating more fiber-rich foods. Lifestyle treatments may include regular exercise. If these diet and lifestyle recommendations do not help, your health care provider may recommend taking over-the-counter laxative medicines to help you have bowel movements. Prescription medicines may be prescribed if over-the-counter medicines do not work.    °HOME CARE INSTRUCTIONS  °· Eat foods that have a lot of fiber, such as fruits, vegetables, whole grains, and beans. °· Limit foods high in fat and processed sugars, such as french fries, hamburgers, cookies, candies, and soda.   °· A fiber supplement may be added to your diet if you cannot get enough fiber from foods.   °· Drink enough fluids to keep your urine clear or pale yellow.   °· Exercise regularly or as directed by your health care provider.   °· Go to the restroom when you have the urge to go. Do not hold it.   °· Only take over-the-counter or prescription medicines as directed by your health care provider. Do not take other medicines for constipation without talking to your health care provider first.   °SEEK IMMEDIATE MEDICAL CARE IF:  °· You have bright red blood in your stool.   °· Your constipation lasts for more than 4 days or gets worse.   °· You have abdominal or rectal pain.   °· You have thin, pencil-like stools.   °· You have unexplained weight loss. °MAKE SURE YOU:  °· Understand these instructions. °· Will watch your condition. °· Will get help right away if you are not doing well or get worse. °  °This information is not intended to replace advice given to you by your health care provider. Make sure you discuss any questions you have with your health care provider. °  °Document Released: 09/07/2004 Document Revised: 12/31/2014 Document Reviewed: 09/21/2013 °Elsevier Interactive Patient Education ©2016 [employer] °Nausea and Vomiting °Nausea is a sick feeling that often comes before throwing up (vomiting). Vomiting is a reflex where   stomach contents come out of your mouth. Vomiting can cause severe loss of body fluids (dehydration). Children and elderly adults can become dehydrated quickly, especially if they also have diarrhea. Nausea and vomiting are symptoms of a condition or disease. It is important to find the cause of your symptoms. °CAUSES  °· Direct irritation of the stomach  lining. This irritation can result from increased acid production (gastroesophageal reflux disease), infection, food poisoning, taking certain medicines (such as nonsteroidal anti-inflammatory drugs), alcohol use, or tobacco use. °· Signals from the brain. These signals could be caused by a headache, heat exposure, an inner ear disturbance, increased pressure in the brain from injury, infection, a tumor, or a concussion, pain, emotional stimulus, or metabolic problems. °· An obstruction in the gastrointestinal tract (bowel obstruction). °· Illnesses such as diabetes, hepatitis, gallbladder problems, appendicitis, kidney problems, cancer, sepsis, atypical symptoms of a heart attack, or eating disorders. °· Medical treatments such as chemotherapy and radiation. °· Receiving medicine that makes you sleep (general anesthetic) during surgery. °DIAGNOSIS °Your caregiver may ask for tests to be done if the problems do not improve after a few days. Tests may also be done if symptoms are severe or if the reason for the nausea and vomiting is not clear. Tests may include: °· Urine tests. °· Blood tests. °· Stool tests. °· Cultures (to look for evidence of infection). °· X-rays or other imaging studies. °Test results can help your caregiver make decisions about treatment or the need for additional tests. °TREATMENT °You need to stay well hydrated. Drink frequently but in small amounts. You may wish to drink water, sports drinks, clear broth, or eat frozen ice pops or gelatin dessert to help stay hydrated. When you eat, eating slowly may help prevent nausea. There are also some antinausea medicines that may help prevent nausea. °HOME CARE INSTRUCTIONS  °· Take all medicine as directed by your caregiver. °· If you do not have an appetite, do not force yourself to eat. However, you must continue to drink fluids. °· If you have an appetite, eat a normal diet unless your caregiver tells you differently. °¨ Eat a variety of complex  carbohydrates (rice, wheat, potatoes, bread), lean meats, yogurt, fruits, and vegetables. °¨ Avoid high-fat foods because they are more difficult to digest. °· Drink enough water and fluids to keep your urine clear or pale yellow. °· If you are dehydrated, ask your caregiver for specific rehydration instructions. Signs of dehydration may include: °¨ Severe thirst. °¨ Dry lips and mouth. °¨ Dizziness. °¨ Dark urine. °¨ Decreasing urine frequency and amount. °¨ Confusion. °¨ Rapid breathing or pulse. °SEEK IMMEDIATE MEDICAL CARE IF:  °· You have blood or brown flecks (like coffee grounds) in your vomit. °· You have black or bloody stools. °· You have a severe headache or stiff neck. °· You are confused. °· You have severe abdominal pain. °· You have chest pain or trouble breathing. °· You do not urinate at least once every 8 hours. °· You develop cold or clammy skin. °· You continue to vomit for longer than 24 to 48 hours. °· You have a fever. °MAKE SURE YOU:  °· Understand these instructions. °· Will watch your condition. °· Will get help right away if you are not doing well or get worse. °  °This information is not intended to replace advice given to you by your health care provider. Make sure you discuss any questions you have with your health care provider. °  °Document Released: 12/10/2005 Document Revised: 03/03/2012   Document Reviewed: 05/09/2011 °Elsevier Interactive Patient Education ©2016 [employer] ° °

## 2016-06-16 NOTE — ED Notes (Signed)
Pt in CT.

## 2016-07-18 ENCOUNTER — Encounter: Payer: Self-pay | Admitting: Gastroenterology

## 2016-09-25 ENCOUNTER — Ambulatory Visit (INDEPENDENT_AMBULATORY_CARE_PROVIDER_SITE_OTHER): Payer: BLUE CROSS/BLUE SHIELD | Admitting: Gastroenterology

## 2016-09-25 ENCOUNTER — Encounter: Payer: Self-pay | Admitting: Gastroenterology

## 2016-09-25 VITALS — BP 90/70 | HR 84 | Ht 64.0 in | Wt 129.4 lb

## 2016-09-25 DIAGNOSIS — R140 Abdominal distension (gaseous): Secondary | ICD-10-CM

## 2016-09-25 DIAGNOSIS — R194 Change in bowel habit: Secondary | ICD-10-CM | POA: Diagnosis not present

## 2016-09-25 DIAGNOSIS — R198 Other specified symptoms and signs involving the digestive system and abdomen: Secondary | ICD-10-CM

## 2016-09-25 NOTE — Progress Notes (Signed)
[facility] Consult Note:  History: Susan Meyers 09/25/2016  Referring physician: Leamon Arnt, MD, Michael Boston, MD  Reason for consult/chief complaint: change in bowel habits (having constipation alternating with dirrhea and frequency, ? if problems after surgery)   Subjective  HPI:  Years of only occ'l constipation, rectal rolapse with nearly every BM.  Had had 2 children by [facility].  6/17 had resection of sigmoid and proximal rectum with rectopexy for rectal prolapse (Dr Johney Maine) Post-op episode severe N/V and severe constipation, went to ED.  Saw Gross afterwards.  Has constipation for 2 days, then has 6-7 BMs in a day. Tried miralax, no help. Now taking 2 fiber gummies in the evening, no help. Often bloated, has pain in tailbone. Weight stable, no rectal bleeding  ROS:  Review of Systems  Constitutional: Negative for appetite change and unexpected weight change.  HENT: Negative for mouth sores and voice change.   Eyes: Negative for pain and redness.  Respiratory: Negative for cough and shortness of breath.   Cardiovascular: Negative for chest pain and palpitations.  Genitourinary: Negative for dysuria and hematuria.  Musculoskeletal: Negative for arthralgias and myalgias.  Skin: Negative for pallor and rash.  Neurological: Negative for weakness and headaches.  Hematological: Negative for adenopathy.     Past Medical History: Past Medical History:  Diagnosis Date  . Headache    Migraines  . Pneumonia   . 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 Rectal prolapse   . Seasonal allergies      Past Surgical History: Past Surgical History:  Procedure Laterality Date  . PROCTOSCOPY N/A 06/06/2016   Procedure: RIGID PROCTOSCOPY;  Surgeon: Michael Boston, MD;  Location: [facility];  Service: General;  Laterality: N/A;     Family History: Family History  Problem Relation Age of Onset   . Hypertension Mother   . Colon cancer Maternal Grandmother   . Cataracts Maternal Grandmother   . Carpal tunnel syndrome Maternal Grandmother     Social History: Social History   Social History  . Marital status: Divorced    Spouse name: N/A  . Number of children: 2  . Years of education: N/A   Occupational History  . communications    Social History Main Topics  . Smoking status: Former Smoker    Quit date: 06/01/2000  . Smokeless tobacco: Never Used  . Alcohol use Yes     Comment: occasionally  . Drug use: No  . Sexual activity: Not Asked   Other Topics Concern  . None   Social History Narrative  . None    Allergies: No Known Allergies  Outpatient Meds: Current Outpatient Prescriptions  Medication Sig Dispense Refill  . acetaminophen (TYLENOL) 500 MG tablet Take 1,000 mg by mouth every 6 (six) hours as needed for moderate pain.     . cetirizine (ZYRTEC) 10 MG tablet Take 10 mg by mouth daily.    Marland Kitchen ibuprofen (ADVIL,MOTRIN) 200 MG tablet Take 400 mg by mouth every 6 (six) hours as needed (For pain.).     Marland Kitchen levonorgestrel (MIRENA) 20 MCG/24HR IUD 1 each by Intrauterine route once.     . naproxen sodium (ANAPROX) 220 MG tablet Take 440 mg by mouth 2 (two) times daily as needed (pain).    . valACYclovir (VALTREX) 500 MG tablet Take 500 mg by mouth daily as needed (For outbreaks.).   0  . VENTOLIN HFA 108 (90 Base)  MCG/ACT inhaler Inhale 2 puffs into the lungs every 4 (four) hours as needed for wheezing.   0  . benzonatate (TESSALON) 100 MG capsule Take 100 mg by mouth 3 (three) times daily as needed for cough.     No current facility-administered medications for this visit.       ___________________________________________________________________ Objective   Exam:  BP 90/70 (BP Location: Left Arm, Patient Position: Sitting, Cuff Size: Normal)   Pulse 84   Ht 5\' 4"  (1.626 m) Comment: height measured without shoes  Wt 129 lb 6 oz (58.7 kg)   BMI 22.21 kg/m     General: this is a(n) Well-appearing woman, thin but with good muscle mass   Eyes: sclera anicteric, no redness  ENT: oral mucosa moist without lesions, no cervical or supraclavicular lymphadenopathy, good dentition  CV: RRR without murmur, S1/S2, no JVD, no peripheral edema  Resp: clear to auscultation bilaterally, normal RR and effort noted  GI: soft, no tenderness, with active bowel sounds. No guarding or palpable organomegaly noted.  Skin; warm and dry, no rash or jaundice noted  Neuro: awake, alert and oriented x 3. Normal gross motor function and fluent speech  Dr. Clyda Greener operative report was reviewed.   CT of the abdomen and pelvis from 06/16/2016 (during ED visit) was personally reviewed. There is a heavy stool burden and typical postsurgical changes in the sigmoid colon and mesentery.  Assessment: Encounter Diagnoses  Name Primary?  . Change in bowel function Yes  . Abdominal bloating     She is experiencing a very common motility change because of resection of the rectosigmoid colon. It has affected the normal neuromuscular sensation of and control of evacuation. That is why it has been difficult to find a happy medium of treatment which does not precipitate diarrhea. I expect much of it will improve on its own with time. I do not think it is postsurgical obstruction.  I explained in detail why think these symptoms are occurring, and she was reassured.  Plan:  Stool softener 1-2 times daily. No further workup planned See me at age 40 for screening colonoscopy in sooner as needed.  Total time 30 minutes, over half spent reviewing records, counseling and coordinate and care. Thank you for the courtesy of this consult.  Please call me with any questions or concerns.  Nelida Meuse III  CC: Leamon Arnt, MD  Michael Boston, MD [facility])

## 2016-09-25 NOTE — Patient Instructions (Signed)
If you are age 43 or older, your body mass index should be between 23-30. Your Body mass index is 22.21 kg/m. If this is out of the aforementioned range listed, please consider follow up with your Primary Care Provider.  If you are age 6 or younger, your body mass index should be between 19-25. Your Body mass index is 22.21 kg/m. If this is out of the aformentioned range listed, please consider follow up with your Primary Care Provider.   Thank you for choosing [facility]  Dr Wilfrid Lund III

## 2019-06-29 ENCOUNTER — Telehealth: Payer: BLUE CROSS/BLUE SHIELD | Admitting: Family

## 2019-06-29 DIAGNOSIS — Z20822 Contact with and (suspected) exposure to covid-19: Secondary | ICD-10-CM

## 2019-06-29 MED ORDER — BENZONATATE 100 MG PO CAPS
100.0000 mg | ORAL_CAPSULE | Freq: Three times a day (TID) | ORAL | 0 refills | Status: DC | PRN
Start: 2019-06-29 — End: 2023-11-19

## 2019-06-29 MED ORDER — ALBUTEROL SULFATE HFA 108 (90 BASE) MCG/ACT IN AERS: 2.0000 | INHALATION_SPRAY | Freq: Four times a day (QID) | RESPIRATORY_TRACT | 1 refills | Status: AC | PRN

## 2019-06-29 NOTE — Progress Notes (Signed)
E-Visit for Corona Virus Screening   Your current symptoms could be consistent with the coronavirus. I sent this to our [employer] They will contact you in the next few hours to set up a time for testing.    [email or web address]  and You have been enrolled in [facility] for COVID-19.  Daily you will receive a questionnaire within the [facility] website. Our COVID-19 response team will be monitoring your responses daily.  Approximately 5 minutes was spent documenting and reviewing patient's chart.    COVID-19 is a respiratory illness with symptoms that are similar to the flu. Symptoms are typically mild to moderate, but there have been cases of severe illness and death due to the virus. The following symptoms may appear 2-14 days after exposure: . Fever . Cough . Shortness of breath or difficulty breathing . Chills . Repeated shaking with chills . Muscle pain . Headache . Sore throat . New loss of taste or smell . Fatigue . Congestion or runny nose . Nausea or vomiting . Diarrhea  It is vitally important that if you feel that you have an infection such as this virus or any other virus that you stay home and away from places where you may spread it to others.  You should self-quarantine for 14 days if you have symptoms that could potentially be coronavirus or have been in close contact a with a person diagnosed with COVID-19 within the last 2 weeks. You should avoid contact with people age 74 and older.   You should wear a mask or cloth face covering over your nose and mouth if you must be around other people or animals, including pets (even at home). Try to stay at least 6 feet away from other people. This will protect the people around you.  You can use medication such as A prescription cough medication called Tessalon Perles 100 mg. You may take 1-2 capsules every 8 hours as needed for cough and A prescription  inhaler called Albuterol MDI 90 mcg /actuation 2 puffs every 4 hours as needed for shortness of breath, wheezing, cough  You may also take acetaminophen (Tylenol) as needed for fever.   Reduce your risk of any infection by using the same precautions used for avoiding the common cold or flu:  Marland Kitchen Wash your hands often with soap and warm water for at least 20 seconds.  If soap and water are not readily available, use an alcohol-based hand sanitizer with at least 60% alcohol.  . If coughing or sneezing, cover your mouth and nose by coughing or sneezing into the elbow areas of your shirt or coat, into a tissue or into your sleeve (not your hands). . Avoid shaking hands with others and consider head nods or verbal greetings only. . Avoid touching your eyes, nose, or mouth with unwashed hands.  . Avoid close contact with people who are sick. . Avoid places or events with large numbers of people in one location, like concerts or sporting events. . Carefully consider travel plans you have or are making. . If you are planning any travel outside or inside the [place], visit the CDC's Travelers' Health webpage for the latest health notices. . If you have some symptoms but not all symptoms, continue to monitor at home and seek medical attention if your symptoms worsen. . If you are having a medical emergency, call 911.  HOME CARE . Only take medications as instructed by your medical team. . Drink plenty of fluids and get  plenty of rest. . A steam or ultrasonic humidifier can help if you have congestion.   GET HELP RIGHT AWAY IF YOU HAVE EMERGENCY WARNING SIGNS** FOR COVID-19. If you or someone is showing any of these signs seek emergency medical care immediately. Call 911 or proceed to your closest emergency facility if: . You develop worsening high fever. . Trouble breathing . Bluish lips or face . Persistent pain or pressure in the chest . New confusion . Inability to wake or stay awake . You cough up  blood. . Your symptoms become more severe  **This list is not all possible symptoms. Contact your medical provider for any symptoms that are sever or concerning to you.   MAKE SURE YOU   Understand these instructions.  Will watch your condition.  Will get help right away if you are not doing well or get worse.  Your e-visit answers were reviewed by a board certified advanced clinical practitioner to complete your personal care plan.  Depending on the condition, your plan could have included both over the counter or prescription medications.  If there is a problem please reply once you have received a response from your provider.  Your safety is important to [place].  If you have drug allergies check your prescription carefully.    You can use MyChart to ask questions about today's visit, request a non-urgent call back, or ask for a work or school excuse for 24 hours related to this e-Visit. If it has been greater than 24 hours you will need to follow up with your provider, or enter a new e-Visit to address those concerns. You will get an e-mail in the next two days asking about your experience.  I hope that your e-visit has been valuable and will speed your recovery. Thank you for using e-visits.

## 2019-06-30 ENCOUNTER — Encounter (INDEPENDENT_AMBULATORY_CARE_PROVIDER_SITE_OTHER): Payer: Self-pay

## 2019-06-30 ENCOUNTER — Telehealth: Payer: Self-pay | Admitting: *Deleted

## 2019-06-30 DIAGNOSIS — Z20822 Contact with and (suspected) exposure to covid-19: Secondary | ICD-10-CM

## 2019-06-30 NOTE — Telephone Encounter (Signed)
Contacted pt to schedule COVID testing; pt accepted appointment at [facility] site 07/02/2019 at 1130; pt given address, location, and instructions that she and all occupants of her vehicle should wear masks; she verbalized understanding; orders placed per protocol.

## 2019-07-02 ENCOUNTER — Other Ambulatory Visit: Payer: BLUE CROSS/BLUE SHIELD

## 2019-07-02 DIAGNOSIS — R6889 Other general symptoms and signs: Secondary | ICD-10-CM | POA: Diagnosis not present

## 2019-07-02 DIAGNOSIS — Z20822 Contact with and (suspected) exposure to covid-19: Secondary | ICD-10-CM

## 2019-07-06 ENCOUNTER — Encounter (INDEPENDENT_AMBULATORY_CARE_PROVIDER_SITE_OTHER): Payer: Self-pay

## 2019-07-06 LAB — NOVEL CORONAVIRUS, NAA: SARS-CoV-2, NAA: NOT DETECTED

## 2019-11-06 ENCOUNTER — Other Ambulatory Visit: Payer: Self-pay

## 2019-11-06 DIAGNOSIS — Z20822 Contact with and (suspected) exposure to covid-19: Secondary | ICD-10-CM

## 2019-11-09 LAB — NOVEL CORONAVIRUS, NAA: SARS-CoV-2, NAA: NOT DETECTED

## 2019-12-25 HISTORY — PX: ABDOMINAL HYSTERECTOMY: SUR658

## 2020-12-28 DIAGNOSIS — Z90722 Acquired absence of ovaries, bilateral: Secondary | ICD-10-CM | POA: Diagnosis not present

## 2020-12-28 DIAGNOSIS — C531 Malignant neoplasm of exocervix: Secondary | ICD-10-CM | POA: Diagnosis not present

## 2020-12-28 DIAGNOSIS — Z90710 Acquired absence of both cervix and uterus: Secondary | ICD-10-CM | POA: Diagnosis not present

## 2020-12-28 DIAGNOSIS — Z8541 Personal history of malignant neoplasm of cervix uteri: Secondary | ICD-10-CM | POA: Diagnosis not present

## 2020-12-28 DIAGNOSIS — Z87891 Personal history of nicotine dependence: Secondary | ICD-10-CM | POA: Diagnosis not present

## 2020-12-28 DIAGNOSIS — Z08 Encounter for follow-up examination after completed treatment for malignant neoplasm: Secondary | ICD-10-CM | POA: Diagnosis not present

## 2021-03-31 DIAGNOSIS — Z20822 Contact with and (suspected) exposure to covid-19: Secondary | ICD-10-CM | POA: Diagnosis not present

## 2021-03-31 DIAGNOSIS — Z8619 Personal history of other infectious and parasitic diseases: Secondary | ICD-10-CM | POA: Diagnosis not present

## 2021-03-31 DIAGNOSIS — R062 Wheezing: Secondary | ICD-10-CM | POA: Diagnosis not present

## 2021-03-31 DIAGNOSIS — J301 Allergic rhinitis due to pollen: Secondary | ICD-10-CM | POA: Diagnosis not present

## 2021-04-04 DIAGNOSIS — K5909 Other constipation: Secondary | ICD-10-CM | POA: Diagnosis not present

## 2021-04-04 DIAGNOSIS — C539 Malignant neoplasm of cervix uteri, unspecified: Secondary | ICD-10-CM | POA: Diagnosis not present

## 2021-04-23 DIAGNOSIS — U071 COVID-19: Secondary | ICD-10-CM

## 2021-04-23 HISTORY — DX: COVID-19: U07.1

## 2021-04-29 DIAGNOSIS — Z20822 Contact with and (suspected) exposure to covid-19: Secondary | ICD-10-CM | POA: Diagnosis not present

## 2021-05-05 ENCOUNTER — Telehealth: Payer: BC Managed Care – PPO | Admitting: Physician Assistant

## 2021-05-05 ENCOUNTER — Encounter: Payer: Self-pay | Admitting: Physician Assistant

## 2021-05-05 DIAGNOSIS — U071 COVID-19: Secondary | ICD-10-CM | POA: Diagnosis not present

## 2021-05-05 MED ORDER — PROMETHAZINE-DM 6.25-15 MG/5ML PO SYRP: 5.0000 mL | ORAL_SOLUTION | Freq: Four times a day (QID) | ORAL | 0 refills | Status: DC | PRN

## 2021-05-05 NOTE — Progress Notes (Signed)
Ms. renell, coaxum are scheduled for a virtual visit with your provider today.    Just as we do with appointments in the office, we must obtain your consent to participate.  Your consent will be active for this visit and any virtual visit you may have with one of our providers in the next 365 days.    If you have a MyChart account, I can also send a copy of this consent to you electronically.  All virtual visits are billed to your insurance company just like a traditional visit in the office.  As this is a virtual visit, video technology does not allow for your provider to perform a traditional examination.  This may limit your provider's ability to fully assess your condition.  If your provider identifies any concerns that need to be evaluated in person or the need to arrange testing such as labs, EKG, etc, we will make arrangements to do so.    Although advances in technology are sophisticated, we cannot ensure that it will always work on either your end or our end.  If the connection with a video visit is poor, we may have to switch to a telephone visit.  With either a video or telephone visit, we are not always able to ensure that we have a secure connection.   I need to obtain your verbal consent now.   Are you willing to proceed with your visit today?   Temia A Newnam has provided verbal consent on 05/05/2021 for a virtual visit (video or telephone).   Mar Daring, PA-C 05/05/2021  11:25 AM    MyChart Video Visit    Virtual Visit via Video Note   This visit type was conducted due to national recommendations for restrictions regarding the COVID-19 Pandemic (e.g. social distancing) in an effort to limit this patient's exposure and mitigate transmission in our community. This patient is at least at moderate risk for complications without adequate follow up. This format is felt to be most appropriate for this patient at this time. Physical exam was limited by quality of the video and audio  technology used for the visit.   Patient location: Home Provider location: Home office in [address] [place]  I discussed the limitations of evaluation and management by telemedicine and the availability of in person appointments. The patient expressed understanding and agreed to proceed.  Patient: Susan Meyers   DOB: Oct 29, 1976   45 y.o. Female  MRN: [id number] Visit Date: 05/05/2021  Today's healthcare provider: Mar Daring, PA-C   No chief complaint on file.  Subjective    URI  This is a new problem. The current episode started in the past 7 days (symptoms worsened 2 days ago; was also tested on 04/29/21 and that was negative). The problem has been gradually worsening. The maximum temperature recorded prior to her arrival was 100.4 - 100.9 F. Associated symptoms include congestion, coughing, headaches, rhinorrhea, sinus pain, a sore throat and vomiting. Pertinent negatives include no ear pain. She has tried antihistamine, sleep and increased fluids (and Zofran) for the symptoms. The treatment provided no relief.    Tested positive for Covid 19 on 05/03/21.  O2 readings at home have been 93%   Patient Active Problem List   Diagnosis Date Noted  . 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 Allergic rhinitis 06/23/2012   Past Medical History:  Diagnosis Date  . Headache    Migraines  . Pneumonia   . Procidentia of rectum  06/02/2013   Overview:  Full thickness; for rectoplexy in future due to cost/cla 05/2013   . Rectal prolapse   . Seasonal allergies       Medications: Outpatient Medications Prior to Visit  Medication Sig  . acetaminophen (TYLENOL) 500 MG tablet Take 1,000 mg by mouth every 6 (six) hours as needed for moderate pain.   Marland Kitchen albuterol (VENTOLIN HFA) 108 (90 Base) MCG/ACT inhaler Inhale 2 puffs into the lungs every 6 (six) hours as needed for wheezing or shortness of breath.  . benzonatate (TESSALON PERLES) 100 MG capsule Take  1 capsule (100 mg total) by mouth 3 (three) times daily as needed.  . cetirizine (ZYRTEC) 10 MG tablet Take 10 mg by mouth daily.  Marland Kitchen ibuprofen (ADVIL,MOTRIN) 200 MG tablet Take 400 mg by mouth every 6 (six) hours as needed (For pain.).   Marland Kitchen levonorgestrel (MIRENA) 20 MCG/24HR IUD 1 each by Intrauterine route once.   . naproxen sodium (ANAPROX) 220 MG tablet Take 440 mg by mouth 2 (two) times daily as needed (pain).  . valACYclovir (VALTREX) 500 MG tablet Take 500 mg by mouth daily as needed (For outbreaks.).    No facility-administered medications prior to visit.    Review of Systems  Constitutional: Positive for appetite change, chills and fever.  HENT: Positive for congestion, rhinorrhea, sinus pain and sore throat. Negative for ear pain and postnasal drip.   Respiratory: Positive for cough and shortness of breath.   Cardiovascular: Negative.   Gastrointestinal: Positive for vomiting.  Neurological: Positive for dizziness and headaches.    Last CBC Lab Results  Component Value Date   WBC 10.0 06/15/2016   HGB 14.4 06/15/2016   HCT 40.7 06/15/2016   MCV 89.8 06/15/2016   MCH 31.8 06/15/2016   RDW 12.7 06/15/2016   PLT 381 41/93/7902   Last metabolic panel Lab Results  Component Value Date   GLUCOSE 124 (H) 06/15/2016   NA 132 (L) 06/15/2016   K 3.6 06/15/2016   CL 100 (L) 06/15/2016   CO2 22 06/15/2016   BUN 6 06/15/2016   CREATININE 0.74 06/15/2016   GFRNONAA >60 06/15/2016   GFRAA >60 06/15/2016   CALCIUM 9.9 06/15/2016   PROT 8.0 06/15/2016   ALBUMIN 4.6 06/15/2016   BILITOT 1.1 06/15/2016   ALKPHOS 42 06/15/2016   AST 26 06/15/2016   ALT 43 06/15/2016   ANIONGAP 10 06/15/2016      Objective    There were no vitals taken for this visit. BP Readings from Last 3 Encounters:  09/25/16 90/70  06/16/16 108/74  06/09/16 115/76   Wt Readings from Last 3 Encounters:  09/25/16 129 lb 6 oz (58.7 kg)  06/09/16 137 lb 12.6 oz (62.5 kg)  06/01/16 132 lb (59.9 kg)       Physical Exam Vitals reviewed.  Constitutional:      General: She is not in acute distress.    Appearance: Normal appearance. She is well-developed. She is ill-appearing.  HENT:     Head: Normocephalic and atraumatic.  Pulmonary:     Effort: Pulmonary effort is normal. No respiratory distress (frequent dry, barking cough heard without issue speaking in full, coherent sentences).  Musculoskeletal:     Cervical back: Normal range of motion and neck supple.  Neurological:     Mental Status: She is alert.  Psychiatric:        Mood and Affect: Mood normal.        Behavior: Behavior normal.  Thought Content: Thought content normal.        Judgment: Judgment normal.       Assessment & Plan     1. COVID-19 - Continue OTC symptomatic management of choice - Promethazine-DM sent in for cough and nausea - Will send OTC vitamins and supplement information through [facility] - Patient enrolled in MyChart symptom monitoring - Push fluids - Rest as needed - Referral to Covid treatment team placed, consult appreciated - Discussed return precautions and when to seek in-person evaluation, sent via [facility] as well - promethazine-dextromethorphan (PROMETHAZINE-DM) 6.25-15 MG/5ML syrup; Take 5 mLs by mouth 4 (four) times daily as needed for cough.  Dispense: 240 mL; Refill: 0 - Ambulatory referral for Covid Treatment - MyChart COVID-19 home monitoring program; Future - Temperature monitoring; Future   No follow-ups on file.     I discussed the assessment and treatment plan with the patient. The patient was provided an opportunity to ask questions and all were answered. The patient agreed with the plan and demonstrated an understanding of the instructions.   The patient was advised to call back or seek an in-person evaluation if the symptoms worsen or if the condition fails to improve as anticipated.  I provided 16 minutes of face-to-face time during this encounter via MyChart Video  enabled encounter.   Rubye Beach Bell [phone] (phone) [phone] (fax)  [place]

## 2021-05-05 NOTE — Patient Instructions (Signed)
Hello Susan Meyers,  You are being placed in the home monitoring program for COVID-19 (commonly known as Coronavirus).  This is because you are suspected to have the virus or are known to have the virus.  If you are unsure which group you fall into call your clinic.    As part of this program, you'll answer a daily questionnaire in the MyChart mobile app. You'll receive a notification through the MyChart app when the questionnaire is available. When you log in to MyChart, you'll see the tasks in your To Do activity.       Clinicians will see any answers that are concerning and take appropriate steps.  If at any point you are having a medical emergency, call 911.  If otherwise concerned call your clinic instead of coming into the clinic or hospital.  To keep from spreading the disease you should: Stay home and limit contact with other people as much as possible.  Wash your hands frequently. Cover your coughs and sneezes with a tissue, and throw used tissues in the trash.   Clean and disinfect frequently touched surfaces and objects.    Take care of yourself by: Staying home Resting Drinking fluids Take fever-reducing medications (Tylenol/Acetaminophen and Ibuprofen)  For more information on the disease go to the Centers for Disease Control and Prevention website     Can take to lessen severity: Vit C 500mg  twice daily Quercertin 250-500mg  twice daily Zinc 75-100mg  daily Melatonin 3-6 mg at bedtime Vit D3 1000-2000 IU daily Aspirin 81 mg daily with food Optional: Famotidine 20mg  daily Also can add tylenol/ibuprofen as needed for fevers and body aches May add Mucinex or Mucinex DM as needed for cough/congestion   10 Things You Can Do to Manage Your COVID-19 Symptoms at Home If you have possible or confirmed COVID-19: 1. Stay home except to get medical care. 2. Monitor your symptoms carefully. If your symptoms get worse, call your healthcare provider immediately. 3. Get rest and stay  hydrated. 4. If you have a medical appointment, call the healthcare provider ahead of time and tell them that you have or may have COVID-19. 5. For medical emergencies, call 911 and notify the dispatch personnel that you have or may have COVID-19. 6. Cover your cough and sneezes with a tissue or use the inside of your elbow. 7. Wash your hands often with soap and water for at least 20 seconds or clean your hands with an alcohol-based hand sanitizer that contains at least 60% alcohol. 8. As much as possible, stay in a specific room and away from other people in your home. Also, you should use a separate bathroom, if available. If you need to be around other people in or outside of the home, wear a mask. 9. Avoid sharing personal items with other people in your household, like dishes, towels, and bedding. 10. Clean all surfaces that are touched often, like counters, tabletops, and doorknobs. Use household cleaning sprays or wipes according to the label instructions. June 07/08/2020 This information is not intended to replace advice given to you by your health care provider. Make sure you discuss any questions you have with your health care provider. Document Revised: 10/24/2020 Document Reviewed: 10/24/2020 Elsevier Patient Education  2021 [place]: What to Do if You Are Sick If you have a fever, cough or other symptoms, you might have COVID-19. Most people have mild illness and are able to recover at home. If you are sick:  Keep track of your  symptoms.  If you have an emergency warning sign (including trouble breathing), call 911. Steps to help prevent the spread of COVID-19 if you are sick If you are sick with COVID-19 or think you might have COVID-19, follow the steps below to care for yourself and to help protect other people in your home and community. Stay home except to get medical care  Stay home. Most people with COVID-19 have mild illness and can  recover at home without medical care. Do not leave your home, except to get medical care. Do not visit public areas.  Take care of yourself. Get rest and stay hydrated. Take over-the-counter medicines, such as acetaminophen, to help you feel better.  Stay in touch with your doctor. Call before you get medical care. Be sure to get care if you have trouble breathing, or have any other emergency warning signs, or if you think it is an emergency.  Avoid public transportation, ride-sharing, or taxis. Separate yourself from other people As much as possible, stay in a specific room and away from other people and pets in your home. If possible, you should use a separate bathroom. If you need to be around other people or animals in or outside of the home, wear a mask. Tell your close contactsthat they may have been exposed to COVID-19. An infected person can spread COVID-19 starting 48 hours (or 2 days) before the person has any symptoms or tests positive. By letting your close contacts know they may have been exposed to COVID-19, you are helping to protect everyone.  Additional guidance is available for those living in close quarters and shared housing.  See COVID-19 and Animals if you have questions about pets.  If you are diagnosed with COVID-19, someone from the health department may call you. Answer the call to slow the spread. Monitor your symptoms  Symptoms of COVID-19 include fever, cough, or other symptoms.  Follow care instructions from your healthcare provider and local health department. Your local health authorities may give instructions on checking your symptoms and reporting information. When to seek emergency medical attention Look for emergency warning signs* for COVID-19. If someone is showing any of these signs, seek emergency medical care immediately:  Trouble breathing  Persistent pain or pressure in the chest  New confusion  Inability to wake or stay awake  Pale, gray, or  blue-colored skin, lips, or nail beds, depending on skin tone *This list is not all possible symptoms. Please call your medical provider for any other symptoms that are severe or concerning to you. Call 911 or call ahead to your local emergency facility: Notify the operator that you are seeking care for someone who has or may have COVID-19. Call ahead before visiting your doctor  Call ahead. Many medical visits for routine care are being postponed or done by phone or telemedicine.  If you have a medical appointment that cannot be postponed, call your doctor's office, and tell them you have or may have COVID-19. This will help the office protect themselves and other patients. Get  tested  If you have symptoms of COVID-19, get tested. While waiting for test results, you stay away from others, including staying apart from those living in your household.  You can visit your state, tribal, local, and territorialhealth department's website to look for the latest local information on testing sites. If you are sick, wear a mask over your nose and mouth  You should wear a mask over your nose and mouth if you must  be around other people or animals, including pets (even at home).  You don't need to wear the mask if you are alone. If you can't put on a mask (because of trouble breathing, for example), cover your coughs and sneezes in some other way. Try to stay at least 6 feet away from other people. This will help protect the people around you.  Masks should not be placed on young children under age 53 years, anyone who has trouble breathing, or anyone who is not able to remove the mask without help. Note: During the COVID-19 pandemic, medical grade facemasks are reserved for healthcare workers and some first responders. Cover your coughs and sneezes  Cover your mouth and nose with a tissue when you cough or sneeze.  Throw away used tissues in a lined trash can.  Immediately wash your hands with soap  and water for at least 20 seconds. If soap and water are not available, clean your hands with an alcohol-based hand sanitizer that contains at least 60% alcohol. Clean your hands often  Wash your hands often with soap and water for at least 20 seconds. This is especially important after blowing your nose, coughing, or sneezing; going to the bathroom; and before eating or preparing food.  Use hand sanitizer if soap and water are not available. Use an alcohol-based hand sanitizer with at least 60% alcohol, covering all surfaces of your hands and rubbing them together until they feel dry.  Soap and water are the best option, especially if hands are visibly dirty.  Avoid touching your eyes, nose, and mouth with unwashed hands.  Handwashing Tips Avoid sharing personal household items  Do not share dishes, drinking glasses, cups, eating utensils, towels, or bedding with other people in your home.  Wash these items thoroughly after using them with soap and water or put in the dishwasher. Clean all "high-touch" surfaces everyday  Clean and disinfect high-touch surfaces in your "sick room" and bathroom; wear disposable gloves. Let someone else clean and disinfect surfaces in common areas, but you should clean your bedroom and bathroom, if possible.  If a caregiver or other person needs to clean and disinfect a sick person's bedroom or bathroom, they should do so on an as-needed basis. The caregiver/other person should wear a mask and disposable gloves prior to cleaning. They should wait as long as possible after the person who is sick has used the bathroom before coming in to clean and use the bathroom. ? High-touch surfaces include phones, remote controls, counters, tabletops, doorknobs, bathroom fixtures, toilets, keyboards, tablets, and bedside tables.  Clean and disinfect areas that may have blood, stool, or body fluids on them.  Use household cleaners and disinfectants. Clean the area or item  with soap and water or another detergent if it is dirty. Then, use a household disinfectant. ? Be sure to follow the instructions on the label to ensure safe and effective use of the product. Many products recommend keeping the surface wet for several minutes to ensure germs are killed. Many also recommend precautions such as wearing gloves and making sure you have good ventilation during use of the product. ? Use a product from [employer] List N: Disinfectants for Coronavirus (URKYH-06). ? Complete Disinfection Guidance When you can be around others after being sick with COVID-19 Deciding when you can be around others is different for different situations. Find out when you can safely end home isolation. For any additional questions about your care, contact your healthcare provider or state  or local health department. 03/09/2020 Content source: [facility] for Immunization and Respiratory Diseases ([facility]), Division of Viral Diseases This information is not intended to replace advice given to you by your health care provider. Make sure you discuss any questions you have with your health care provider. Document Revised: 10/24/2020 Document Reviewed: 10/24/2020 Elsevier Patient Education  2021 [employer].

## 2021-05-06 ENCOUNTER — Telehealth: Payer: Self-pay | Admitting: Infectious Diseases

## 2021-05-06 DIAGNOSIS — U071 COVID-19: Secondary | ICD-10-CM

## 2021-05-06 MED ORDER — NIRMATRELVIR/RITONAVIR (PAXLOVID)TABLET: 3.0000 | ORAL_TABLET | Freq: Two times a day (BID) | ORAL | 0 refills | Status: AC

## 2021-05-06 NOTE — Telephone Encounter (Signed)
Called to discuss with patient about COVID-19 symptoms and the use of one of the available treatments for those with mild to moderate Covid symptoms and at a high risk of hospitalization.  Pt appears to qualify for outpatient treatment due to co-morbid conditions and/or a member of an at-risk group in accordance with the FDA Emergency Use Authorization.    Symptom onset:  Youngest son tested positive last Friday  Vaccinated: yes Booster? yes Immunocompromised? no Qualifiers: smoking history  NIH Criteria: [place]    Outpatient Oral COVID Treatment Note  I connected with Susan Meyers on 05/06/2021/6:04 PM by telephone and verified that I am speaking with the correct person using two identifiers.  I discussed the limitations, risks, security, and privacy concerns of performing an evaluation and management service by telephone and the availability of in person appointments. I also discussed with the patient that there may be a patient responsible charge related to this service. The patient expressed understanding and agreed to proceed.  Patient location: [place] residence Provider location: [place] residence  Diagnosis: COVID-19 infection  Purpose of visit: Discussion of potential use of Molnupiravir or Paxlovid, a new treatment for mild to moderate COVID-19 viral infection in non-hospitalized patients.   Subjective: Patient is a 45 y.o. female who has been diagnosed with COVID 19 viral infection.  Their symptoms began on 5/11 with URI symptoms, fever, fatigue.    Past Medical History:  Diagnosis Date  . Headache    Migraines  . Pneumonia   . 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 Rectal prolapse   . Seasonal allergies     No Known Allergies   Current Outpatient Medications:  .  acetaminophen (TYLENOL) 500 MG tablet, Take 1,000 mg by mouth every 6 (six) hours as needed for moderate pain. , Disp: , Rfl:   .  albuterol (VENTOLIN HFA) 108 (90 Base) MCG/ACT inhaler, Inhale 2 puffs into the lungs every 6 (six) hours as needed for wheezing or shortness of breath., Disp: 6.7 g, Rfl: 1 .  benzonatate (TESSALON PERLES) 100 MG capsule, Take 1 capsule (100 mg total) by mouth 3 (three) times daily as needed., Disp: 20 capsule, Rfl: 0 .  cetirizine (ZYRTEC) 10 MG tablet, Take 10 mg by mouth daily., Disp: , Rfl:  .  ibuprofen (ADVIL,MOTRIN) 200 MG tablet, Take 400 mg by mouth every 6 (six) hours as needed (For pain.). , Disp: , Rfl:  .  levonorgestrel (MIRENA) 20 MCG/24HR IUD, 1 each by Intrauterine route once. , Disp: , Rfl:  .  naproxen sodium (ANAPROX) 220 MG tablet, Take 440 mg by mouth 2 (two) times daily as needed (pain)., Disp: , Rfl:  .  nirmatrelvir/ritonavir EUA (PAXLOVID) TABS, Take 3 tablets by mouth 2 (two) times daily for 5 days. Take nirmatrelvir (150 mg) 2 tablet(s) twice daily for 5 days and ritonavir (100 mg) one tablet twice daily for 5 days., Disp: 30 tablet, Rfl: 0 .  promethazine-dextromethorphan (PROMETHAZINE-DM) 6.25-15 MG/5ML syrup, Take 5 mLs by mouth 4 (four) times daily as needed for cough., Disp: 240 mL, Rfl: 0 .  valACYclovir (VALTREX) 500 MG tablet, Take 500 mg by mouth daily as needed (For outbreaks.). , Disp: , Rfl: 0  Objective: Patient appears/sounds ill and uncomfortable.  They are in no apparent distress.  Breathing is non labored.  Mood and behavior are normal.  Laboratory Data:  No results found for this  or any previous visit (from the past 2160 hour(s)).   Assessment: 45 y.o. female with mild/moderate COVID 19 viral infection diagnosed on 5/11 with home test at high risk for progression to severe COVID 19.  Plan:  This patient is a 45 y.o. female that meets the following criteria for Emergency Use Authorization of: Paxlovid 1. Age >12 yr AND > 40 kg 2. SARS-COV-2 positive test 3. Symptom onset < 5 days 4. Mild-to-moderate COVID disease with high risk for severe  progression to hospitalization or death  I have spoken and communicated the following to the patient or parent/caregiver regarding: 1. Paxlovid is an unapproved drug that is authorized for use under an Emergency Use Authorization.  2. There are no adequate, approved, available products for the treatment of COVID-19 in adults who have mild-to-moderate COVID-19 and are at high risk for progressing to severe COVID-19, including hospitalization or death. 3. Other therapeutics are currently authorized. For additional information on all products authorized for treatment or prevention of COVID-19, please see [email or web address].  4. There are benefits and risks of taking this treatment as outlined in the "Fact Sheet for Patients and Caregivers."  5. "Fact Sheet for Patients and Caregivers" was reviewed with patient. A hard copy will be provided to patient from pharmacy prior to the patient receiving treatment. 6. Patients should continue to self-isolate and use infection control measures (e.g., wear mask, isolate, social distance, avoid sharing personal items, clean and disinfect "high touch" surfaces, and frequent handwashing) according to CDC guidelines.  7. The patient or parent/caregiver has the option to accept or refuse treatment. 8. Patient medication history was reviewed for potential drug interactions:Interaction with home meds: Flonase (patient will hold for now), Mirena IUD - counseled to use alternative BC method for 2 weeks  9. Patient's GFR was calculated to be 127, and they were therefore prescribed Normal dose (GFR>60) - nirmatrelvir 150mg  tab (2 tablet) by mouth twice daily AND ritonavir 100mg  tab (1 tablet) by mouth twice daily   After reviewing above information with the patient, the patient agrees to receive Paxlovid.  Follow up instructions:    . Take prescription BID x 5 days as  directed . Reach out to pharmacist for counseling on medication if desired . For concerns regarding further COVID symptoms please follow up with your PCP or urgent care . For urgent or life-threatening issues, seek care at your local emergency department  The patient was provided an opportunity to ask questions, and all were answered. The patient agreed with the plan and demonstrated an understanding of the instructions.   Script sent to patient's preferred pharmacy  The patient was advised to call their PCP or seek an in-person evaluation if the symptoms worsen or if the condition fails to improve as anticipated.   I provided 8 minutes of non face-to-face telephone visit time during this encounter, and > 50% was spent counseling as documented under my assessment & plan.  Janene Madeira, NP 05/06/2021 /6:04 PM

## 2021-06-01 ENCOUNTER — Ambulatory Visit (INDEPENDENT_AMBULATORY_CARE_PROVIDER_SITE_OTHER): Payer: BC Managed Care – PPO | Admitting: Gastroenterology

## 2021-06-01 ENCOUNTER — Encounter: Payer: Self-pay | Admitting: Gastroenterology

## 2021-06-01 VITALS — BP 104/70 | HR 87 | Ht 64.0 in | Wt 129.4 lb

## 2021-06-01 DIAGNOSIS — R140 Abdominal distension (gaseous): Secondary | ICD-10-CM

## 2021-06-01 DIAGNOSIS — K5909 Other constipation: Secondary | ICD-10-CM | POA: Diagnosis not present

## 2021-06-01 NOTE — Progress Notes (Signed)
[facility] Consult Note:  History: Susan Meyers 06/01/2021  Referring provider: Bernerd Limbo, MD  Reason for consult/chief complaint: Constipation (Patient states that she is having a bowel movement every day, but if she does not have a BM she will vomit.  She states it is a "constant cycle."), Emesis (Patient states the past two week she has had a cough which causes he to vomit. ), Bloated ("Intense bloating"), and Back Pain (Lower back pain)   Subjective  HPI: This is a very pleasant 45 year old patient I last saw in October 2017 for constipation and bowel habit changes with bloating that occurred after a sigmoid resection and rectopexy for prolapse. She says symptoms worsened last summer when she had a TAH/BSO for cervical cancer.  She was getting different recommendations from her surgeon and other providers regarding what meds to take, whether they should be oral therapy or suppository and how often to take them.  If she does not have a BM every day, she feels markedly bloated and has pain in her lower back.  It might even then cause her to vomit.  The last several weeks she has had post COVID coughing and when that gets into severe fits that is also caused some vomiting.  She denies rectal bleeding, her appetite is generally good and weight stable.  Deidre Ala took Linzess after the pelvic surgery, usually at 72 mcg dose but occasionally at the 145 dose, and this seems to work reasonably well.  The stool would usually be semiformed and sometimes loose but at least she was having BMs and felt less bloated.  Earlier this year her insurance was no longer covered, so she went on Trulance and that did not work as well.  Sometimes she would take that with Senokot or might use a suppository.  Her gynecologist told her that if she felt impacted she should put a finger in her vagina to help the stool passed through the rectum. 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   Overview:  Full thickness; for rectoplexy in future due to cost/cla 05/2013    Rectal prolapse    Seasonal allergies      Past Surgical History: Past Surgical History:  Procedure Laterality Date   ABDOMINAL HYSTERECTOMY     PROCTOSCOPY N/A 06/06/2016   Procedure: RIGID PROCTOSCOPY;  Surgeon: Michael Boston, MD;  Location: [facility];  Service: General;  Laterality: N/A;     Family History: Family History  Problem Relation Age of Onset   Hypertension Mother    Atrial fibrillation Mother    Atrial fibrillation Maternal Grandmother    Colon cancer Maternal Grandmother    Cataracts Maternal Grandmother    Carpal tunnel syndrome Maternal Grandmother    Esophageal cancer Neg Hx    Pancreatic cancer Neg Hx    Stomach cancer Neg Hx     Social History: Social History   Socioeconomic History   Marital status: Married    Spouse name: Not on file   Number of children: 2   Years of education: Not on file   Highest education level: Not on file  Occupational History   Occupation: communications  Tobacco Use   Smoking status: Former    Pack years: 0.00    Types: Cigarettes    Quit date: 06/01/2000    Years since quitting: 21.0  Smokeless tobacco: Never  Vaping Use   Vaping Use: Never used  Substance and Sexual Activity   Alcohol use: Yes    Comment: occasionally   Drug use: No   Sexual activity: Not on file  Other Topics Concern   Not on file  Social History Narrative   Not on file   Social Determinants of Health   Financial Resource Strain: Not on file  Food Insecurity: Not on file  Transportation Needs: Not on file  Physical Activity: Not on file  Stress: Not on file  Social Connections: Not on file    Allergies: No Known Allergies  Outpatient Meds: Current Outpatient Medications  Medication Sig Dispense Refill   acetaminophen (TYLENOL)  500 MG tablet Take 1,000 mg by mouth every 6 (six) hours as needed for moderate pain.      albuterol (VENTOLIN HFA) 108 (90 Base) MCG/ACT inhaler Inhale 2 puffs into the lungs every 6 (six) hours as needed for wheezing or shortness of breath. 6.7 g 1   benzonatate (TESSALON PERLES) 100 MG capsule Take 1 capsule (100 mg total) by mouth 3 (three) times daily as needed. 20 capsule 0   cetirizine (ZYRTEC) 10 MG tablet Take 10 mg by mouth daily.     estradiol (VIVELLE-DOT) 0.075 MG/24HR 1 patch 2 (two) times a week.     fluconazole (DIFLUCAN) 150 MG tablet Take 1 tablet once for yeast infection; may repeat once in 5 days if needed     ibuprofen (ADVIL,MOTRIN) 200 MG tablet Take 400 mg by mouth every 6 (six) hours as needed (For pain.).      Sennosides 25 MG TABS Take 1 tablet by mouth daily.     TRULANCE 3 MG TABS Take 1 tablet by mouth daily.     valACYclovir (VALTREX) 500 MG tablet Take 500 mg by mouth daily as needed (For outbreaks.).   0   No current facility-administered medications for this visit.      ___________________________________________________________________ Objective   Exam:  BP 104/70 (BP Location: Left Arm, Patient Position: Sitting, Cuff Size: Normal)   Pulse 87   Ht 5\' 4"  (1.626 m)   Wt 129 lb 6 oz (58.7 kg)   SpO2 98%   BMI 22.21 kg/m  Wt Readings from Last 3 Encounters:  06/01/21 129 lb 6 oz (58.7 kg)  09/25/16 129 lb 6 oz (58.7 kg)  06/09/16 137 lb 12.6 oz (62.5 kg)    General: She is well-appearing Eyes: sclera anicteric, no redness ENT: oral mucosa moist without lesions, no cervical or supraclavicular lymphadenopathy CV: RRR without murmur, S1/S2, no JVD, no peripheral edema Resp: clear to auscultation bilaterally, normal RR and effort noted GI: soft, no tenderness, with active bowel sounds. No guarding or palpable organomegaly noted. Skin; warm and dry, no rash or jaundice noted Neuro: awake, alert and oriented x 3. Normal gross motor function and  fluent speech  Labs:  No recent data  Assessment: Encounter Diagnoses  Name Primary?   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  Anastomotic stricture seems less likely. The abdominal discomfort and bloating are related to the constipation.  Plan:  Colonoscopy.  This will rule out stricture and she is due for screening.  We had samples of Linzess 72 mcg, and she was happy that she could resume  that at least as long as the samples last.  After that, she will most likely go back on Trulance. If her insurance does not cover Linzess and Trulance is not effective, it might be possible to get prior authorization for Motegrity.  Even if so, she may still have some difficulty because of probable pelvic floor dysfunction as noted above.  She had gotten some advice regarding breathing exercises and abdominal pressure to help aid bowel movements but had not found them very helpful.  Hilliary had hoped there might be some treatment so that she did not need to take medication regularly for this, but given the nature of this issue I think she will need to be on long-term constipation treatment.  Thank you for the courtesy of this consult.  Please call me with any questions or concerns.  Nelida Meuse III  CC: Referring provider noted above

## 2021-06-01 NOTE — Patient Instructions (Signed)
If you are age 47 or older, your body mass index should be between 23-30. Your Body mass index is 22.21 kg/m. If this is out of the aforementioned range listed, please consider follow up with your Primary Care Provider.  If you are age 40 or younger, your body mass index should be between 19-25. Your Body mass index is 22.21 kg/m. If this is out of the aformentioned range listed, please consider follow up with your Primary Care Provider.   __________________________________________________________  The [facility] providers would like to encourage you to use [facility] to communicate with providers for non-urgent requests or questions.  Due to long hold times on the telephone, sending your provider a message by [facility] may be a faster and more efficient way to get a response.  Please allow 48 business hours for a response.  Please remember that this is for non-urgent requests.

## 2021-06-14 DIAGNOSIS — L309 Dermatitis, unspecified: Secondary | ICD-10-CM | POA: Diagnosis not present

## 2021-06-14 DIAGNOSIS — R059 Cough, unspecified: Secondary | ICD-10-CM | POA: Diagnosis not present

## 2021-06-14 DIAGNOSIS — R1110 Vomiting, unspecified: Secondary | ICD-10-CM | POA: Diagnosis not present

## 2021-06-14 DIAGNOSIS — I83892 Varicose veins of left lower extremities with other complications: Secondary | ICD-10-CM | POA: Diagnosis not present

## 2021-06-14 DIAGNOSIS — R4189 Other symptoms and signs involving cognitive functions and awareness: Secondary | ICD-10-CM | POA: Diagnosis not present

## 2021-06-27 ENCOUNTER — Telehealth: Payer: Self-pay

## 2021-06-27 NOTE — Telephone Encounter (Signed)
Scheduled Previsit on 07/05/21 patient to arrive at 3:20 pm. Scheduled Colonoscopy on 07/11/21 at 10:00 am. Patient aware of the above.

## 2021-07-05 ENCOUNTER — Ambulatory Visit (AMBULATORY_SURGERY_CENTER): Payer: Self-pay | Admitting: *Deleted

## 2021-07-05 ENCOUNTER — Other Ambulatory Visit: Payer: Self-pay

## 2021-07-05 VITALS — Ht 64.0 in | Wt 128.0 lb

## 2021-07-05 DIAGNOSIS — Z1211 Encounter for screening for malignant neoplasm of colon: Secondary | ICD-10-CM

## 2021-07-05 DIAGNOSIS — K5909 Other constipation: Secondary | ICD-10-CM

## 2021-07-05 MED ORDER — PLENVU 140 G PO SOLR: 1.0000 | ORAL | 0 refills | Status: DC

## 2021-07-05 NOTE — Progress Notes (Signed)
No egg or soy allergy known to patient   issues with past sedation with any surgeries or procedures of PONV  Patient denies ever being told they had issues or difficulty with intubation  No FH of Malignant Hyperthermia No diet pills per patient No home 02 use per patient  No blood thinners per patient   Pt states  issues with constipation - Senna PM and Trulance AM - having soft daily BM's in the morning- having increased bloating recently and by the Pm's feels full again- uses Glycerin Supp PRN as well in the evenings   Plenvu coupon to pt  No A fib or A flutter  EMMI video to pt or via Mobile 19 guidelines implemented in PV today with Pt and RN  Pt is fully vaccinated  for Covid   Due to the COVID-19 pandemic we are asking patients to follow certain guidelines.  Pt aware of COVID protocols and [facility] guidelines

## 2021-07-06 ENCOUNTER — Telehealth: Payer: Self-pay | Admitting: *Deleted

## 2021-07-06 NOTE — Telephone Encounter (Signed)
Plenvu $50-  pt states she cannot afford the $50 and needs something cheaper-  sent in Miralax Instructions via her My chart   Pt clarified veggies over the phone- instructed to avoid all RAW veggies and Nuts as well as seeds etc that we discussed yesterday- she asked if she could eat raw cucumbers and tomatoes as they are classified as fruits- instructed no- no fruits or veggies with seeds  Pt verbalized understaffing

## 2021-07-07 ENCOUNTER — Encounter: Payer: Self-pay | Admitting: Gastroenterology

## 2021-07-11 ENCOUNTER — Other Ambulatory Visit: Payer: Self-pay

## 2021-07-11 ENCOUNTER — Ambulatory Visit (AMBULATORY_SURGERY_CENTER): Payer: BC Managed Care – PPO | Admitting: Gastroenterology

## 2021-07-11 ENCOUNTER — Encounter: Payer: Self-pay | Admitting: Gastroenterology

## 2021-07-11 VITALS — BP 109/76 | HR 59 | Temp 97.5°F | Resp 13 | Ht 64.0 in | Wt 128.0 lb

## 2021-07-11 DIAGNOSIS — Z1211 Encounter for screening for malignant neoplasm of colon: Secondary | ICD-10-CM

## 2021-07-11 DIAGNOSIS — K5909 Other constipation: Secondary | ICD-10-CM

## 2021-07-11 MED ORDER — SODIUM CHLORIDE 0.9 % IV SOLN: 500.0000 mL | Freq: Once | INTRAVENOUS | Status: DC

## 2021-07-11 NOTE — Progress Notes (Signed)
Medical history reviewed with no changes noted. VS assessed by C.W 

## 2021-07-11 NOTE — Op Note (Addendum)
[facility] Patient Name: Susan Meyers Procedure Date: 07/11/2021 10:27 AM MRN: [id number] Endoscopist: Echo. Loletha Carrow , MD Age: 45 Referring MD:  Date of Birth: September 23, 1976 Gender: Female Account #: [id number] Procedure:                Colonoscopy Indications:              Screening for colorectal malignant neoplasm, This                            is the patient's first colonoscopy                           Incidental constipation noted - improved on                            Trulance (and sometimes Linzess depending on                            insurance approval)                           Prior sigmoid resection and rectopexy for prolapse Medicines:                Monitored Anesthesia Care Procedure:                Pre-Anesthesia Assessment:                           - Prior to the procedure, a History and Physical                            was performed, and patient medications and                            allergies were reviewed. The patient's tolerance of                            previous anesthesia was also reviewed. The risks                            and benefits of the procedure and the sedation                            options and risks were discussed with the patient.                            All questions were answered, and informed consent                            was obtained. Prior Anticoagulants: The patient has                            taken no previous anticoagulant or antiplatelet  agents. ASA Grade Assessment: II - A patient with                            mild systemic disease. After reviewing the risks                            and benefits, the patient was deemed in                            satisfactory condition to undergo the procedure.                           After obtaining informed consent, the colonoscope                            was passed under direct vision. Throughout the                             procedure, the patient's blood pressure, pulse, and                            oxygen saturations were monitored continuously. The                            PCF-HQ190L Colonoscope was introduced through the                            anus and advanced to the the cecum, identified by                            appendiceal orifice and ileocecal valve. The                            colonoscopy was performed without difficulty. The                            patient tolerated the procedure well. The quality                            of the bowel preparation was fair in some areas                            despite lavage. The ileocecal valve, appendiceal                            orifice, and rectum were photographed. The bowel                            preparation used was Miralax (patient choice due to                            cost) Scope In: 10:39:04 AM Scope Out: 10:51:54 AM Scope Withdrawal Time: 0 hours 8 minutes 36 seconds  Total Procedure Duration:  0 hours 12 minutes 50 seconds  Findings:                 The perianal and digital rectal examinations were                            normal.                           An area of melanosis was found in the entire colon.                           There was evidence of a prior end-to-end                            colo-colonic anastomosis in the recto-sigmoid                            colon. This was patent and was characterized by                            healthy appearing mucosa.                           The exam was otherwise without abnormality on                            direct and retroflexion views. Complications:            No immediate complications. Estimated Blood Loss:     Estimated blood loss: none. Impression:               - Preparation of the colon was fair.                           - Melanosis in the colon.                           - Patent end-to-end colo-colonic anastomosis,                             characterized by healthy appearing mucosa.                           - The examination was otherwise normal on direct                            and retroflexion views.                           - No specimens collected. Recommendation:           - Patient has a contact number available for                            emergencies. The signs and symptoms of potential  delayed complications were discussed with the                            patient. Return to normal activities tomorrow.                            Written discharge instructions were provided to the                            patient.                           - Resume previous diet.                           - Continue present medications.                           - Repeat colonoscopy in 3 years for screening                            purposes due to prep quality. 4-liter PEG prep for                            next exam. Marium Ragan L. Loletha Carrow, MD 07/11/2021 11:02:13 AM This report has been signed electronically.

## 2021-07-11 NOTE — Progress Notes (Signed)
To pacu, VSS. Report to [email or web address] 

## 2021-07-11 NOTE — Patient Instructions (Signed)
Repeat colonoscopy in 3 years for screening purposes, due to prep quality.   YOU HAD AN ENDOSCOPIC PROCEDURE TODAY AT [facility]:   Refer to the procedure report that was given to you for any specific questions about what was found during the examination.  If the procedure report does not answer your questions, please call your gastroenterologist to clarify.  If you requested that your care partner not be given the details of your procedure findings, then the procedure report has been included in a sealed envelope for you to review at your convenience later.  YOU SHOULD EXPECT: Some feelings of bloating in the abdomen. Passage of more gas than usual.  Walking can help get rid of the air that was put into your GI tract during the procedure and reduce the bloating. If you had a lower endoscopy (such as a colonoscopy or flexible sigmoidoscopy) you may notice spotting of blood in your stool or on the toilet paper. If you underwent a bowel prep for your procedure, you may not have a normal bowel movement for a few days.  Please Note:  You might notice some irritation and congestion in your nose or some drainage.  This is from the oxygen used during your procedure.  There is no need for concern and it should clear up in a day or so.  SYMPTOMS TO REPORT IMMEDIATELY:  Following lower endoscopy (colonoscopy or flexible sigmoidoscopy):  Excessive amounts of blood in the stool  Significant tenderness or worsening of abdominal pains  Swelling of the abdomen that is new, acute  Fever of 100F or higher  For urgent or emergent issues, a gastroenterologist can be reached at any hour by calling [phone]. Do not use MyChart messaging for urgent concerns.    DIET:  We do recommend a small meal at first, but then you may proceed to your regular diet.  Drink plenty of fluids but you should avoid alcoholic beverages for 24 hours.  ACTIVITY:  You should plan to take it easy for the rest of  today and you should NOT DRIVE or use heavy machinery until tomorrow (because of the sedation medicines used during the test).    FOLLOW UP: Our staff will call the number listed on your records 48-72 hours following your procedure to check on you and address any questions or concerns that you may have regarding the information given to you following your procedure. If we do not reach you, we will leave a message.  We will attempt to reach you two times.  During this call, we will ask if you have developed any symptoms of COVID 19. If you develop any symptoms (ie: fever, flu-like symptoms, shortness of breath, cough etc.) before then, please call [phone].  If you test positive for Covid 19 in the 2 weeks post procedure, please call and report this information to [place].    If any biopsies were taken you will be contacted by phone or by letter within the next 1-3 weeks.  Please call us at [phone] if you have not heard about the biopsies in 3 weeks.    SIGNATURES/CONFIDENTIALITY: You and/or your care partner have signed paperwork which will be entered into your electronic medical record.  These signatures attest to the fact that that the information above on your After Visit Summary has been reviewed and is understood.  Full responsibility of the confidentiality of this discharge information lies with you and/or your care-partner.

## 2021-07-13 ENCOUNTER — Telehealth: Payer: Self-pay

## 2021-07-13 NOTE — Telephone Encounter (Signed)
First attempt follow up call to pt, lm for pt to call if having any problems or questions, otherwise we will call them back later this morning or early this afternoon.  °

## 2021-07-13 NOTE — Telephone Encounter (Signed)
Second attempt follow up call to pt, lm on vm. 

## 2021-09-12 NOTE — Telephone Encounter (Signed)
[place],  Please reassure her that I do not think the dark bowel movements are cause for concern.  As best she can, see if a prior authorization or something like that is needed so we can try to get Linzess 145 mcg daily for her. She has failed Trulance and OTC treatments like MiraLAX and Senokot  HD

## 2021-10-05 MED ORDER — LINACLOTIDE 72 MCG PO CAPS: ORAL_CAPSULE | ORAL | 1 refills | Status: DC

## 2021-10-23 ENCOUNTER — Telehealth: Payer: Self-pay

## 2021-10-23 NOTE — Telephone Encounter (Signed)
Spoke with Susan Meyers at [employer]. She states that the [employer] needs a PA. She will re-fax the request today.

## 2021-10-23 NOTE — Telephone Encounter (Signed)
PA has been submitted with cover my meds

## 2021-10-27 NOTE — Telephone Encounter (Signed)
Dr Loletha Carrow please advise as Linzess was denied. Thank you    Drug Alternatives The following medications may be more likely to be covered by your patient's insurance plan. We have determined that alternative medications for your patient are likely:  Drug Name Lactulose 10GM/15ML solution MOVANTIK 25MG  tablets Lubiprostone 24MCG capsules Motegrity 2MG  tablets Trulance 3MG  tablets Bisacodyl EC 5MG  dr tablets

## 2021-10-31 NOTE — Telephone Encounter (Signed)
Thank you for the note  It is difficult to tell from that PA notification which of these medicines will be approved at what cost. Also, some of them either have not worked for her or would not be appropriate for the clinical scenario.  If it can be approved at a reasonable price, I would like her to try Motegrity 2 mg once daily. Dispense 30, refill 1  If that is declined, then lubiprostone 24 mcg once daily, dispense 30, refill 1  - HD

## 2021-11-01 NOTE — Telephone Encounter (Signed)
Left message to return call 

## 2021-11-08 NOTE — Telephone Encounter (Signed)
Per the fax from the patients insurance company the [employer] has been approved.

## 2021-12-29 ENCOUNTER — Other Ambulatory Visit: Payer: Self-pay | Admitting: Gastroenterology

## 2022-01-09 ENCOUNTER — Encounter: Payer: Self-pay | Admitting: Pulmonary Disease

## 2022-01-09 ENCOUNTER — Ambulatory Visit (INDEPENDENT_AMBULATORY_CARE_PROVIDER_SITE_OTHER): Payer: BC Managed Care – PPO | Admitting: Pulmonary Disease

## 2022-01-09 ENCOUNTER — Other Ambulatory Visit: Payer: Self-pay

## 2022-01-09 VITALS — BP 116/74 | HR 91 | Temp 98.3°F | Ht 63.0 in | Wt 136.0 lb

## 2022-01-09 DIAGNOSIS — U099 Post covid-19 condition, unspecified: Secondary | ICD-10-CM

## 2022-01-09 DIAGNOSIS — R059 Cough, unspecified: Secondary | ICD-10-CM | POA: Diagnosis not present

## 2022-01-09 LAB — CBC WITH DIFFERENTIAL/PLATELET
Basophils Absolute: 0 10*3/uL (ref 0.0–0.1)
Basophils Relative: 0.8 % (ref 0.0–3.0)
Eosinophils Absolute: 0 10*3/uL (ref 0.0–0.7)
Eosinophils Relative: 0.6 % (ref 0.0–5.0)
HCT: 42.6 % (ref 36.0–46.0)
Hemoglobin: 14.2 g/dL (ref 12.0–15.0)
Lymphocytes Relative: 16.5 % (ref 12.0–46.0)
Lymphs Abs: 0.8 10*3/uL (ref 0.7–4.0)
MCHC: 33.4 g/dL (ref 30.0–36.0)
MCV: 94.5 fl (ref 78.0–100.0)
Monocytes Absolute: 0.3 10*3/uL (ref 0.1–1.0)
Monocytes Relative: 5.8 % (ref 3.0–12.0)
Neutro Abs: 3.9 10*3/uL (ref 1.4–7.7)
Neutrophils Relative %: 76.3 % (ref 43.0–77.0)
Platelets: 319 10*3/uL (ref 150.0–400.0)
RBC: 4.5 Mil/uL (ref 3.87–5.11)
RDW: 13.8 % (ref 11.5–15.5)
WBC: 5.1 10*3/uL (ref 4.0–10.5)

## 2022-01-09 MED ORDER — PANTOPRAZOLE SODIUM 40 MG PO TBEC: 40.0000 mg | DELAYED_RELEASE_TABLET | Freq: Every day | ORAL | 5 refills | Status: DC

## 2022-01-09 NOTE — Patient Instructions (Signed)
We get records and PFTs from your old pulmonologist Check CBC with differential today Schedule high-res CT for better evaluation of the lung.  Continue Symbicort We will resume Protonix 40 mg a day For postnasal drip you can use nasonex over-the-counter and an antihistamine such as Benadryl or chlorpheniramine  Follow-up in 1 month.

## 2022-01-09 NOTE — Progress Notes (Signed)
Susan Meyers    [id number]    1976/11/16  Primary Care Physician:Bouska, Shanon Brow, MD  Referring Physician: Mindi Curling, PA-C [address] [place],  [place] [id number]  Chief complaint: Consult for chronic cough  HPI: 46 year old with history of uterine cancer, allergies, constipation She developed COVID-19 in May 2022 treated with prednisone and Paxlovid.  She contracted COVID again in early December 2022 and was given another round of prednisone and nebulizer.  Since these 2 episodes she has had chronic cough, chest tightness and shortness of breath.  She has paroxysms of cough in the morning that sometimes causes her to throw up.  Cough is nonproductive in nature  She has occasional seasonal allergies, postnasal drip.  She was started on a PPI for GERD but cannot tell if it helped with her symptoms She was evaluated by Dr. Michela Pitcher, pulmonologist in [place] with PFTs showing restriction but no obstruction.  She was given Symbicort inhaler  Pets: Dog, snake, Iguana, Geko, [place]anatula, Tree frog Occupation: Nature conservation officer for spiritual studies at [employer]: No mold, hot tub, Jacuzzi.  She does not know if she has feather pillows or comforter Smoking history: 10-pack-year smoker.  Quit in 2001 Travel history: No significant travel history Relevant family history: No family history of lung disease  Outpatient Encounter Medications as of 01/09/2022  Medication Sig   acetaminophen (TYLENOL) 500 MG tablet Take 1,000 mg by mouth every 6 (six) hours as needed for moderate pain.    albuterol (VENTOLIN HFA) 108 (90 Base) MCG/ACT inhaler Inhale 2 puffs into the lungs every 6 (six) hours as needed for wheezing or shortness of breath.   cetirizine (ZYRTEC) 10 MG tablet Take 10 mg by mouth daily.   estradiol (VIVELLE-DOT) 0.075 MG/24HR 1 patch 2 (two) times a week.   fluticasone (FLONASE) 50 MCG/ACT nasal spray Place 2 sprays into both nostrils  daily.   ibuprofen (ADVIL,MOTRIN) 200 MG tablet Take 400 mg by mouth every 6 (six) hours as needed (For pain.).    ipratropium-albuterol (DUONEB) 0.5-2.5 (3) MG/3ML SOLN Inhale into the lungs.   LINZESS 72 MCG capsule TAKE 1 CAPSULE BY MOUTH DAILY X 7 DAYS THEN INCREASE TO 2 CAPSULES DAILY AS NEEDED   Sennosides 25 MG TABS Take 1 tablet by mouth daily.   SYMBICORT 160-4.5 MCG/ACT inhaler Inhale 2 puffs into the lungs 2 (two) times daily.   TRULANCE 3 MG TABS Take 1 tablet by mouth daily.   valACYclovir (VALTREX) 500 MG tablet Take 500 mg by mouth daily as needed (For outbreaks.).   benzonatate (TESSALON PERLES) 100 MG capsule Take 1 capsule (100 mg total) by mouth 3 (three) times daily as needed. (Patient not taking: Reported on 07/11/2021)   [DISCONTINUED] fluconazole (DIFLUCAN) 150 MG tablet Take 1 tablet once for yeast infection; may repeat once in 5 days if needed (Patient not taking: Reported on 07/05/2021)   No facility-administered encounter medications on file as of 01/09/2022.    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  Overview:  Full thickness; for rectoplexy in future due to cost/cla 05/2013    Rectal prolapse    Seasonal allergies     Past Surgical History:  Procedure Laterality Date   ABDOMINAL HYSTERECTOMY  2021   COLONOSCOPY     POLYPECTOMY     PROCTOSCOPY N/A 06/06/2016   Procedure: RIGID PROCTOSCOPY;  Surgeon: Michael Boston, MD;  Location: [facility];  Service: General;  Laterality: N/A;   SIGMOIDOSCOPY      Family History  Problem Relation Age of Onset   Hypertension Mother    Atrial fibrillation Mother    Atrial fibrillation  Maternal Grandmother    Colon cancer Maternal Grandmother    Cataracts Maternal Grandmother    Carpal tunnel syndrome Maternal Grandmother    Esophageal cancer Neg Hx    Pancreatic cancer Neg Hx    Stomach cancer Neg Hx    Colon polyps Neg Hx    Rectal cancer Neg Hx     Social History   Socioeconomic History   Marital status: Married    Spouse name: Not on file   Number of children: 2   Years of education: Not on file   Highest education level: Not on file  Occupational History   Occupation: communications  Tobacco Use   Smoking status: Former    Types: Cigarettes    Quit date: 06/01/2000    Years since quitting: 21.6   Smokeless tobacco: Never  Vaping Use   Vaping Use: Never used  Substance and Sexual Activity   Alcohol use: Yes    Comment: occasionally   Drug use: No   Sexual activity: Not on file  Other Topics Concern   Not on file  Social History Narrative   Not on file   Social Determinants of Health   Financial Resource Strain: Not on file  Food Insecurity: Not on file  Transportation Needs: Not on file  Physical Activity: Not on file  Stress: Not on file  Social Connections: Not on file  Intimate Partner Violence: Not on file    Review of systems: Review of Systems  Constitutional: Negative for fever and chills.  HENT: Negative.   Eyes: Negative for blurred vision.  Respiratory: as per HPI  Cardiovascular: Negative for chest pain and palpitations.  Gastrointestinal: Negative for vomiting, diarrhea, blood per rectum. Genitourinary: Negative for dysuria, urgency, frequency and hematuria.  Musculoskeletal: Negative for myalgias, back pain and joint pain.  Skin: Negative for itching and rash.  Neurological: Negative for dizziness, tremors, focal weakness, seizures and loss of consciousness.  Endo/Heme/Allergies: Negative for environmental allergies.  Psychiatric/Behavioral: Negative for depression, suicidal ideas and hallucinations.  All other systems  reviewed and are negative.  Physical Exam: Blood pressure 116/74, pulse 91, temperature 98.3 F (36.8 C), temperature source Oral, height 5\' 3"  (1.6 m), weight 136 lb (61.7 kg), last menstrual period 03/07/2016, SpO2 97 %. Gen:      No acute distress HEENT:  EOMI, sclera anicteric Neck:     No masses; no thyromegaly Lungs:    Clear to auscultation bilaterally; normal respiratory effort CV:         Regular rate and rhythm; no murmurs Abd:      + bowel sounds; soft, non-tender; no palpable masses, no distension Ext:    No edema; adequate peripheral perfusion Skin:      Warm and dry; no rash Neuro: alert and oriented x 3 Psych: normal mood and affect  Data Reviewed: Imaging: CT abdomen pelvis 06/16/2016- visualized lung  bases are clear Chest x-ray 06/15/2016-no acute cardiopulmonary abnormality Chest x-ray report 06/30/2021 [facility]-no acute cardiopulmonary abnormality  PFTs: 09/20/2021-[facility] Normal spirometry, TLC is moderately reduced.  Normal diffusion capacity  Labs: IgE 06/14/2021-negative for food allergies, total IgE 14, RAST panel is positive for dust mite, grass  Assessment:  Consult for cough Post COVID-19 Moderate asthma with chronic cough Is currently on Symbicort inhaler which seems to be helping some.  I will restart her Protonix as I think GERD is playing a role in symptoms.  For postnasal drip she will try over-the-counter nasonex and antihistamine  Get high-res CT for better visualization of the lungs Obtain PFT records from her pulmonologist Continue Symbicort  Plan/Recommendations: Continue Symbicort, albuterol as needed Start Protonix Over-the-counter steroid nasal spray and antihistamine High-res CT PFT records  Marshell Garfinkel MD [facility] 01/09/2022, 9:09 AM  CC: Mindi Curling, PA-C

## 2022-01-23 ENCOUNTER — Inpatient Hospital Stay: Admission: RE | Admit: 2022-01-23 | Payer: BC Managed Care – PPO | Source: Ambulatory Visit

## 2022-02-02 ENCOUNTER — Ambulatory Visit (INDEPENDENT_AMBULATORY_CARE_PROVIDER_SITE_OTHER)
Admission: RE | Admit: 2022-02-02 | Discharge: 2022-02-02 | Disposition: A | Discharge status: Home / Self Care | Payer: BC Managed Care – PPO | Source: Ambulatory Visit | Attending: Pulmonary Disease | Admitting: Pulmonary Disease

## 2022-02-02 ENCOUNTER — Other Ambulatory Visit: Payer: Self-pay

## 2022-02-02 DIAGNOSIS — R059 Cough, unspecified: Secondary | ICD-10-CM | POA: Diagnosis not present

## 2022-04-02 ENCOUNTER — Telehealth: Payer: Self-pay

## 2022-04-02 NOTE — Telephone Encounter (Signed)
Susan Meyers (Key: [id number]) ?Linzess 72MCG capsules ?    ?Status: PA Response - Approved ?

## 2022-04-02 NOTE — Telephone Encounter (Signed)
PA for Linzess 26mh BID has been submitted with cover my meds. ? ?Your information has been submitted to [employer] [employer] [place] will review the request and notify you of the determination decision directly, typically within 72 hours of receiving all information. ?

## 2022-08-14 ENCOUNTER — Other Ambulatory Visit: Payer: Self-pay | Admitting: Gastroenterology

## 2022-11-05 ENCOUNTER — Other Ambulatory Visit: Payer: Self-pay | Admitting: Gastroenterology

## 2022-11-05 NOTE — Telephone Encounter (Signed)
Please advise 

## 2022-11-06 NOTE — Telephone Encounter (Signed)
This medicine can be refilled for 1 month with an additional refill.  She also needs a clinic visit with me in December or January, since she was last seen in July 2022.  - HD

## 2023-05-01 ENCOUNTER — Ambulatory Visit: Payer: BC Managed Care – PPO | Admitting: Gastroenterology

## 2023-05-01 ENCOUNTER — Encounter: Payer: Self-pay | Admitting: Gastroenterology

## 2023-05-01 VITALS — BP 118/68 | HR 68 | Ht 64.0 in | Wt 133.0 lb

## 2023-05-01 DIAGNOSIS — K5909 Other constipation: Secondary | ICD-10-CM

## 2023-05-01 MED ORDER — MOTEGRITY 2 MG PO TABS: 1.0000 | ORAL_TABLET | Freq: Every day | ORAL | 1 refills | Status: DC

## 2023-05-01 NOTE — Patient Instructions (Addendum)
_______________________________________________________  If your blood pressure at your visit was 140/90 or greater, please contact your primary care physician to follow up on this.  _______________________________________________________  If you are age 49 or older, your body mass index should be between 23-30. Your Body mass index is 22.83 kg/m. If this is out of the aforementioned range listed, please consider follow up with your Primary Care Provider.  If you are age 49 or younger, your body mass index should be between 19-25. Your Body mass index is 22.83 kg/m. If this is out of the aformentioned range listed, please consider follow up with your Primary Care Provider.   ________________________________________________________  The [facility] providers would like to encourage you to use [facility] to communicate with providers for non-urgent requests or questions.  Due to long hold times on the telephone, sending your provider a message by [facility] may be a faster and more efficient way to get a response.  Please allow 48 business hours for a response.  Please remember that this is for non-urgent requests.  _______________________________________________________  We have sent the following medications to your pharmacy for you to pick up at your convenience:  Motegrity

## 2023-05-01 NOTE — Progress Notes (Signed)
[facility] Progress Note  Chief Complaint: Chronic constipation  Subjective  History: Summary of pertinent GI issues: Initially seen by Dr. Myrtie Neither in 2017 for chronic constipation and bloating resulting from sigmoid resection and rectopexy done for prolapse.  Relief for years from Linzess, then intermittent insurance coverage issues. Seen by me again in June 2022 for similar issues, that worsened after a TAH/BSO for cervical cancer. Screening colonoscopy July 2022: Healthy-appearing left colon anastomosis, complete exam, fair prep in some areas.  3-year recall recommended with GoLytely prep.(She chose Miralax prep due to cost)  Patient can continue to use Linzess or Trulance (depending on insurance coverage) with variable results. _______________________   Family continues to take Linzess 72 mcg most days, and it causes urgency with loose stool.  She still has intermittent bloating.  Wondered about when she was due for her next colonoscopy because she recalls the fair prep on last exam. She is also experienced some pressure-like discomfort and itching in the perianal area, tried some OTC hemorrhoidal ointments without improvement. (20 minutes late for today's appointment today because her child was ill)  ROS: Cardiovascular:  no chest pain Respiratory: no dyspnea  The patient's Past Medical, Family and Social History were reviewed and are on file in the EMR.  Objective:  Med list reviewed  Current Outpatient Medications:    acetaminophen (TYLENOL) 500 MG tablet, Take 1,000 mg by mouth every 6 (six) hours as needed for moderate pain. , Disp: , Rfl:    albuterol (VENTOLIN HFA) 108 (90 Base) MCG/ACT inhaler, Inhale 2 puffs into the lungs every 6 (six) hours as needed for wheezing or shortness of breath., Disp: 6.7 g, Rfl: 1   benzonatate (TESSALON PERLES) 100 MG capsule, Take 1 capsule (100 mg total) by mouth 3 (three) times daily as needed., Disp: 20 capsule, Rfl: 0    cetirizine (ZYRTEC) 10 MG tablet, Take 10 mg by mouth daily., Disp: , Rfl:    estradiol (VIVELLE-DOT) 0.075 MG/24HR, 1 patch 2 (two) times a week., Disp: , Rfl:    fluticasone (FLONASE) 50 MCG/ACT nasal spray, Place 2 sprays into both nostrils daily., Disp: , Rfl:    ibuprofen (ADVIL,MOTRIN) 200 MG tablet, Take 400 mg by mouth every 6 (six) hours as needed (For pain.). , Disp: , Rfl:    ipratropium-albuterol (DUONEB) 0.5-2.5 (3) MG/3ML SOLN, Inhale into the lungs., Disp: , Rfl:    LINZESS 72 MCG capsule, TAKE 1 CAPSULE(72 MCG) BY MOUTH TWICE DAILY. FOLLOW UP FOR FURTHER REFILLS, Disp: 60 capsule, Rfl: 1   pantoprazole (PROTONIX) 40 MG tablet, Take 1 tablet (40 mg total) by mouth at bedtime., Disp: 30 tablet, Rfl: 5   Prucalopride Succinate (MOTEGRITY) 2 MG TABS, Take 1 tablet (2 mg total) by mouth daily., Disp: 30 tablet, Rfl: 1   Sennosides 25 MG TABS, Take 1 tablet by mouth daily., Disp: , Rfl:    SYMBICORT 160-4.5 MCG/ACT inhaler, Inhale 2 puffs into the lungs 2 (two) times daily., Disp: , Rfl:    valACYclovir (VALTREX) 500 MG tablet, Take 500 mg by mouth daily as needed (For outbreaks.)., Disp: , Rfl: 0   Vital signs in last 24 hrs: Vitals:   05/01/23 0858  BP: 118/68  Pulse: 68   Wt Readings from Last 3 Encounters:  05/01/23 133 lb (60.3 kg)  01/09/22 136 lb (61.7 kg)  07/11/21 128 lb (58.1 kg)    Physical Exam Well-appearing  Abdomen: soft, no tenderness, with active bowel sounds. Perianal exam with  a small redundant posterior skin fold and faint irritation between other skin folds.  No evidence of fistula or rash  Labs:   ___________________________________________ Radiologic studies:   ____________________________________________ Other:   _____________________________________________ Assessment & Plan  Assessment: Encounter Diagnosis  Name Primary?   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  Perianal  symptoms, probably because the stool is typically loose.  Pain skin irritation, recommended use of moist toilet wipes and perhaps a small amount of barrier ointment such as zinc oxide rather than hemorrhoidal cream.  Linzess 72 mcg generally works but causes loose stool and urgency.  I recommended a trial of Motegrity 2 mg daily instead of Linzess if affordable.  Prescription sent, then she can make a decision on whether to try that or have [place] refill the Linzess.  Due for next colonoscopy approximately July 2025    20 minutes were spent on this encounter (including chart review, history/exam, counseling/coordination of care, and documentation) > 50% of that time was spent on counseling and coordination of care.   Charlie Pitter III

## 2023-05-03 ENCOUNTER — Other Ambulatory Visit (HOSPITAL_COMMUNITY): Payer: Self-pay

## 2023-05-06 ENCOUNTER — Other Ambulatory Visit: Payer: Self-pay | Admitting: Gastroenterology

## 2023-05-06 ENCOUNTER — Telehealth: Payer: Self-pay

## 2023-05-06 ENCOUNTER — Other Ambulatory Visit (HOSPITAL_COMMUNITY): Payer: Self-pay

## 2023-05-06 NOTE — Telephone Encounter (Signed)
PA request received via CMM for Motegrity 2MG  tablets  PA [place] [employer] via CMM and is pending determination  Key: [id number]  *tried Trulance

## 2023-05-06 NOTE — Telephone Encounter (Signed)
Patient Advocate Encounter  Prior Authorization for MOTEGRITY 2MG  has been approved with [employer].    PA# [id number] Effective dates: 5.13.24 through 5.13.25  Per [facility] test claim, copay for 30 days supply is $90

## 2023-05-27 ENCOUNTER — Ambulatory Visit (INDEPENDENT_AMBULATORY_CARE_PROVIDER_SITE_OTHER): Payer: Self-pay | Admitting: Physician Assistant

## 2023-05-27 ENCOUNTER — Other Ambulatory Visit: Payer: Self-pay

## 2023-05-27 ENCOUNTER — Encounter: Payer: Self-pay | Admitting: Physician Assistant

## 2023-05-27 VITALS — BP 92/64 | HR 84 | Temp 97.4°F

## 2023-05-27 DIAGNOSIS — J069 Acute upper respiratory infection, unspecified: Secondary | ICD-10-CM

## 2023-05-27 DIAGNOSIS — R5383 Other fatigue: Secondary | ICD-10-CM

## 2023-05-27 MED ORDER — PREDNISONE 20 MG PO TABS: 20.0000 mg | ORAL_TABLET | Freq: Two times a day (BID) | ORAL | 0 refills | Status: DC

## 2023-05-27 NOTE — Progress Notes (Signed)
[facility] [address] [place], [place] [address]   Office Visit Note  Patient Name: Susan Meyers Date of Birth [id number]  Medical Record number [id number]  Date of Service: 05/27/2023  Chief Complaint  Patient presents with   URI    State she has had children in her home with strep, patient had a Telehealth last Thursday, with amox prescribed, states that she is feeling better but feels that she should be feeling better than she does, states she has had a round of prednisone a few weeks ago for a rash, at home covid was negative, states she has been more thirsty than normal and state she has been feeling more fatigued.   States she has had a rash but that has not cleared up     [name] presents to the clinic for c/o dizzy, head congestion, fatigue x few days. She feels like hearing "echo" when someone speaks. Tested negative for covid. Has taken Prednisone to help clear up a rash within the last month. Currently taking Amoxicillin for sinus infection and states doesn't feel much improved. She has an "upset stomach" from being on antibiotic. Denies recent air travel. No recent open water activities.   URI  Associated symptoms include congestion and headaches. Pertinent negatives include no ear pain (discomfort), sinus pain or sore throat.      Current Medication:  Outpatient Encounter Medications as of 05/27/2023  Medication Sig   predniSONE (DELTASONE) 20 MG tablet Take 1 tablet (20 mg total) by mouth 2 (two) times daily with a meal.   acetaminophen (TYLENOL) 500 MG tablet Take 1,000 mg by mouth every 6 (six) hours as needed for moderate pain.    albuterol (VENTOLIN HFA) 108 (90 Base) MCG/ACT inhaler Inhale 2 puffs into the lungs every 6 (six) hours as needed for wheezing or shortness of breath.   benzonatate (TESSALON PERLES) 100 MG capsule Take 1 capsule (100 mg total) by mouth 3 (three) times daily as needed.   cetirizine (ZYRTEC) 10 MG tablet Take 10 mg by mouth  daily.   estradiol (VIVELLE-DOT) 0.075 MG/24HR 1 patch 2 (two) times a week.   fluticasone (FLONASE) 50 MCG/ACT nasal spray Place 2 sprays into both nostrils daily.   ibuprofen (ADVIL,MOTRIN) 200 MG tablet Take 400 mg by mouth every 6 (six) hours as needed (For pain.).    ipratropium-albuterol (DUONEB) 0.5-2.5 (3) MG/3ML SOLN Inhale into the lungs.   LINZESS 72 MCG capsule TAKE 1 CAPSULE(72 MCG) BY MOUTH TWICE DAILY. FOLLOW UP FOR FURTHER REFILLS   pantoprazole (PROTONIX) 40 MG tablet Take 1 tablet (40 mg total) by mouth at bedtime.   Prucalopride Succinate (MOTEGRITY) 2 MG TABS Take 1 tablet (2 mg total) by mouth daily.   Sennosides 25 MG TABS Take 1 tablet by mouth daily.   SYMBICORT 160-4.5 MCG/ACT inhaler Inhale 2 puffs into the lungs 2 (two) times daily.   valACYclovir (VALTREX) 500 MG tablet Take 500 mg by mouth daily as needed (For outbreaks.).   No facility-administered encounter medications on file as of 05/27/2023.      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   HENT:  Positive for congestion. Negative for ear pain (discomfort), sinus pressure, sinus pain, sore throat and trouble swallowing.   Eyes: Negative.   Respiratory: Negative.    Cardiovascular: Negative.   Endocrine:       Fatigued  Skin: Negative.   Neurological:  Positive for dizziness and headaches. Negative  for syncope, speech difficulty, weakness, light-headedness and numbness.  Psychiatric/Behavioral: Negative.      Physical Exam Constitutional:      Appearance: Normal appearance.  HENT:     Head: Atraumatic.     Right Ear: Tympanic membrane, ear canal and external ear normal.     Left Ear: Tympanic membrane, ear canal and external ear normal.     Nose: Nose normal.     Mouth/Throat:     Mouth: Mucous membranes are moist.     Pharynx: Oropharynx is clear.  Eyes:     Extraocular Movements: Extraocular movements intact.  Cardiovascular:     Rate and Rhythm: Normal rate and regular rhythm.  Pulmonary:     Effort: Pulmonary effort is normal.     Breath sounds: Normal breath sounds.  Musculoskeletal:     Cervical back: Neck supple.  Skin:    General: Skin is warm.  Neurological:     Mental Status: She is alert.  Psychiatric:        Mood and Affect: Mood normal.        Behavior: Behavior normal.        Thought Content: Thought content normal.        Judgment: Judgment normal.       Assessment/Plan:  1. Viral upper respiratory tract infection  2. Fatigue, unspecified type  Continue to stay well hydrated. Take otc oral Sudafed as directed on the box. If symptoms don't improve after 3-4 days then start oral Prednisone as prescribed. Continue with Flonase and otc anti-histamine as previously instructed. Consider to switch the brand of oral anti-histamine.  Briefly discussed blood work for fatigue feeling including to r/o anemia, thyroid disorder, Mono, Lyme disease, etc. Pt verbalized understanding. Also briefly discussed ENT referral vs Neuro referral for her dizziness. Pt verbalized understanding and in agreement.   General Counseling: Yajayra verbalizes understanding of the findings of todays visit and agrees with plan of treatment. I have discussed any further diagnostic evaluation that may be needed or ordered today. We also reviewed her medications today. she has been  encouraged to call the office with any questions or concerns that should arise related to todays visit.    Time spent:30 Minutes    Gilberto Better, [place] Physician Assistant

## 2023-05-29 NOTE — Telephone Encounter (Signed)
Patient called to speak with a nurse regarding the Motegrity not being covered.

## 2023-05-30 NOTE — Telephone Encounter (Signed)
Left a message to inform patient that the PA was approved in May 2024 to May 2025. I also sent her the PA # via mychart

## 2023-06-22 ENCOUNTER — Encounter: Payer: Self-pay | Admitting: Gastroenterology

## 2023-06-24 MED ORDER — LINACLOTIDE 72 MCG PO CAPS: 72.0000 ug | ORAL_CAPSULE | Freq: Every day | ORAL | 1 refills | Status: DC

## 2023-07-02 ENCOUNTER — Telehealth: Payer: Self-pay

## 2023-07-02 NOTE — Telephone Encounter (Signed)
PA for Linzess 72 mcg as been sent to our PA team via fax. Will await return communication

## 2023-07-04 NOTE — Telephone Encounter (Signed)
PA has been submitted with covermymeds  key # [id number]

## 2023-07-05 NOTE — Telephone Encounter (Signed)
Patient and pharmacy aware  

## 2023-07-05 NOTE — Telephone Encounter (Signed)
Approved 07-04-2023 to 07-03-2024

## 2023-07-18 ENCOUNTER — Ambulatory Visit: Payer: BC Managed Care – PPO | Admitting: Gastroenterology

## 2023-09-17 ENCOUNTER — Encounter: Payer: Self-pay | Admitting: Gastroenterology

## 2023-09-18 ENCOUNTER — Other Ambulatory Visit: Payer: Self-pay

## 2023-09-18 MED ORDER — LINACLOTIDE 72 MCG PO CAPS: 72.0000 ug | ORAL_CAPSULE | Freq: Every day | ORAL | 1 refills | Status: DC

## 2023-09-18 NOTE — Telephone Encounter (Signed)
Inbound call from patient, requesting to speak to Sheralyn Boatman in regards to message below.

## 2023-11-18 ENCOUNTER — Other Ambulatory Visit (HOSPITAL_COMMUNITY): Payer: Self-pay

## 2023-11-19 ENCOUNTER — Telehealth: Payer: Self-pay | Admitting: Gastroenterology

## 2023-11-19 ENCOUNTER — Other Ambulatory Visit: Payer: Self-pay | Admitting: Physician Assistant

## 2023-11-19 ENCOUNTER — Encounter: Payer: Self-pay | Admitting: Physician Assistant

## 2023-11-19 ENCOUNTER — Ambulatory Visit (INDEPENDENT_AMBULATORY_CARE_PROVIDER_SITE_OTHER): Payer: Self-pay | Admitting: Physician Assistant

## 2023-11-19 ENCOUNTER — Other Ambulatory Visit: Payer: Self-pay

## 2023-11-19 VITALS — BP 110/76 | HR 80 | Temp 97.5°F | Ht 64.0 in | Wt 130.0 lb

## 2023-11-19 DIAGNOSIS — J45901 Unspecified asthma with (acute) exacerbation: Secondary | ICD-10-CM

## 2023-11-19 DIAGNOSIS — J9801 Acute bronchospasm: Secondary | ICD-10-CM

## 2023-11-19 DIAGNOSIS — J22 Unspecified acute lower respiratory infection: Secondary | ICD-10-CM

## 2023-11-19 MED ORDER — FLUTICASONE-SALMETEROL 500-50 MCG/ACT IN AEPB: 1.0000 | INHALATION_SPRAY | Freq: Two times a day (BID) | RESPIRATORY_TRACT | 1 refills | Status: DC

## 2023-11-19 MED ORDER — MONTELUKAST SODIUM 10 MG PO TABS: 10.0000 mg | ORAL_TABLET | Freq: Every day | ORAL | 3 refills | Status: AC

## 2023-11-19 MED ORDER — AZITHROMYCIN 250 MG PO TABS: ORAL_TABLET | ORAL | 0 refills | Status: DC

## 2023-11-19 MED ORDER — SYMBICORT 160-4.5 MCG/ACT IN AERO: 2.0000 | INHALATION_SPRAY | Freq: Two times a day (BID) | RESPIRATORY_TRACT | 1 refills | Status: DC

## 2023-11-19 MED ORDER — BENZONATATE 200 MG PO CAPS: 200.0000 mg | ORAL_CAPSULE | Freq: Three times a day (TID) | ORAL | 0 refills | Status: AC | PRN

## 2023-11-19 MED ORDER — PROMETHAZINE-DM 6.25-15 MG/5ML PO SYRP
5.0000 mL | ORAL_SOLUTION | Freq: Every evening | ORAL | 0 refills | Status: DC | PRN
Start: 2023-11-19 — End: 2024-10-06

## 2023-11-19 MED ORDER — LINACLOTIDE 72 MCG PO CAPS: 72.0000 ug | ORAL_CAPSULE | Freq: Every day | ORAL | 1 refills | Status: DC

## 2023-11-19 NOTE — Progress Notes (Signed)
Pt requested a change of inhaler due to the cost.

## 2023-11-19 NOTE — Telephone Encounter (Signed)
PT is requesting the refill be sent to [facility] on [address]

## 2023-11-19 NOTE — Telephone Encounter (Signed)
PT needs refill on Linzess that requires a PA to increase dosage. She needs it sent out today but if it cannot due to holiday she is asking what she should do and is it possible to have samples. Please advise.

## 2023-11-19 NOTE — Progress Notes (Signed)
[facility] [address] [place], [place] [address]   Office Visit Note  Patient Name: Susan Meyers Date of Birth [id number]  Medical Record number [id number]  Date of Service: 11/19/2023  Chief Complaint  Patient presents with   Acute Visit    Patient c/o a persistent cough with green blood-tinged sputum, SOB/chest tightness, and states she feels like she is choking at night. She has been on two rounds of prednisone within the past few weeks and feels better when she is on it, however once she states once she completes the medication her symptoms return. She has been using her rescue inhaler every 4 hours and also took a Duoneb treatment yesterday. She has also been taking Mucinex but has seen little improvement in symptoms.      [name] presents to the clinic for c/o shortness of breath, chest tightness x 2-3 weeks. She recently finished Prednisone for her symptoms, but after finishing her medicine her symptoms of chest tightness returned. H/O asthma on Albuterol and Symbicort. She used Nebulizer machine last night because of ongoing coughing fits and unable to sleep. Had 2 rounds of prednisone in the past 2 months. Last dose of prednisone about 2 weeks. She is also using [employer] which does help only for a short time temporarily. Denies fever or chills.       Current Medication:  Outpatient Encounter Medications as of 11/19/2023  Medication Sig   acetaminophen (TYLENOL) 500 MG tablet Take 1,000 mg by mouth every 6 (six) hours as needed for moderate pain.    albuterol (VENTOLIN HFA) 108 (90 Base) MCG/ACT inhaler Inhale 2 puffs into the lungs every 6 (six) hours as needed for wheezing or shortness of breath.   azithromycin (ZITHROMAX) 250 MG tablet Take 2 tablets on day 1, then 1 tablet daily on days 2 through 5   benzonatate (TESSALON) 200 MG capsule Take 1 capsule (200 mg total) by mouth 3 (three) times daily as needed for cough.   cetirizine (ZYRTEC) 10 MG tablet  Take 10 mg by mouth daily.   estradiol (ESTRACE) 1 MG tablet Take 1 mg by mouth daily.   fluticasone (FLONASE) 50 MCG/ACT nasal spray Place 2 sprays into both nostrils daily.   ibuprofen (ADVIL,MOTRIN) 200 MG tablet Take 400 mg by mouth every 6 (six) hours as needed (For pain.).    ipratropium-albuterol (DUONEB) 0.5-2.5 (3) MG/3ML SOLN Inhale into the lungs as needed.   linaclotide (LINZESS) 72 MCG capsule Take 1 capsule (72 mcg total) by mouth daily before breakfast. Pharmacy-please d/c rx for motegrity   montelukast (SINGULAIR) 10 MG tablet Take 1 tablet (10 mg total) by mouth at bedtime.   promethazine-dextromethorphan (PROMETHAZINE-DM) 6.25-15 MG/5ML syrup Take 5 mLs by mouth at bedtime as needed for cough.   Sennosides 25 MG TABS Take 1 tablet by mouth daily.   valACYclovir (VALTREX) 500 MG tablet Take 500 mg by mouth daily as needed (For outbreaks.).   [DISCONTINUED] SYMBICORT 160-4.5 MCG/ACT inhaler Inhale 2 puffs into the lungs 2 (two) times daily.   estradiol (VIVELLE-DOT) 0.075 MG/24HR 1 patch 2 (two) times a week. (Patient not taking: Reported on 11/19/2023)   SYMBICORT 160-4.5 MCG/ACT inhaler Inhale 2 puffs into the lungs 2 (two) times daily.   [DISCONTINUED] benzonatate (TESSALON PERLES) 100 MG capsule Take 1 capsule (100 mg total) by mouth 3 (three) times daily as needed.   [DISCONTINUED] pantoprazole (PROTONIX) 40 MG tablet Take 1 tablet (40 mg total) by mouth at  bedtime.   [DISCONTINUED] predniSONE (DELTASONE) 20 MG tablet Take 1 tablet (20 mg total) by mouth 2 (two) times daily with a meal.   No facility-administered encounter medications on file as of 11/19/2023.      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   HENT:  Positive for congestion. Negative for postnasal drip, sinus pressure, sinus pain and trouble swallowing.   Respiratory:  Positive for cough, chest tightness, shortness of breath and wheezing. Negative for apnea, choking and stridor.   Neurological: Negative.     Physical Exam Vitals reviewed.  Constitutional:      Appearance: Normal appearance.  HENT:     Head: Atraumatic.     Right Ear: Tympanic membrane, ear canal and external ear normal.     Left Ear: Tympanic membrane, ear canal and external ear normal.     Nose: Nose normal.     Mouth/Throat:     Mouth: Mucous membranes are moist.     Pharynx: Oropharynx is clear.  Eyes:     Extraocular Movements: Extraocular movements intact.  Cardiovascular:     Rate and Rhythm: Normal rate and regular rhythm.  Pulmonary:     Effort: Pulmonary effort is normal. No respiratory distress.     Breath sounds: Normal breath sounds. No stridor. No wheezing or rhonchi.     Comments: PT with continuous cough during examination Musculoskeletal:     Cervical back: Neck supple.  Skin:    General: Skin is warm.  Neurological:     Mental Status: She is alert.  Psychiatric:        Mood and Affect: Mood normal.        Behavior: Behavior normal.        Thought Content: Thought content normal.        Judgment: Judgment normal.       Assessment/Plan:  1. Bronchospasm - benzonatate (TESSALON) 200 MG capsule; Take 1 capsule (200 mg total) by mouth 3 (three) times daily as needed for cough.  Dispense: 30 capsule; Refill: 0  2. Exacerbation of  asthma, unspecified asthma severity, unspecified whether persistent - montelukast (SINGULAIR) 10 MG tablet; Take 1 tablet (10 mg total) by mouth at bedtime.  Dispense: 30 tablet; Refill: 3 - SYMBICORT 160-4.5 MCG/ACT inhaler; Inhale 2 puffs into the lungs 2 (two) times daily.  Dispense: 1 each; Refill: 1 - promethazine-dextromethorphan (PROMETHAZINE-DM) 6.25-15 MG/5ML syrup; Take 5 mLs by mouth at bedtime as needed for cough.  Dispense: 118 mL; Refill: 0  3. Lower respiratory infection - azithromycin (ZITHROMAX) 250 MG tablet; Take 2 tablets on day 1, then 1 tablet daily on days  2 through 5  Dispense: 6 tablet; Refill: 0 - promethazine-dextromethorphan (PROMETHAZINE-DM) 6.25-15 MG/5ML syrup; Take 5 mLs by mouth at bedtime as needed for cough.  Dispense: 118 mL; Refill: 0  Increase fluids Start medicines as discussed. Continue to watch for worsening symptoms. She will defer the use of Zpak only if symptoms worsen or don't improve over the next 3-4 days.  Briefly discussed oral steroids again if her symptoms are not controlled. Pt verbalized understanding and in agreement.   General Counseling: Shawnette verbalizes understanding of the findings of todays visit and agrees with plan of treatment. I have discussed any further diagnostic evaluation that may be needed or ordered today. We also reviewed her medications today. she has been encouraged to call the office with any questions or concerns that should arise related to todays visit.    Time spent:20 Minutes    Gilberto Better, [place] Physician Assistant

## 2023-11-20 ENCOUNTER — Telehealth: Payer: Self-pay

## 2023-11-20 NOTE — Telephone Encounter (Signed)
Called patient and sent [facility]- also put samples at the front- patient verbalized understanding

## 2023-11-25 ENCOUNTER — Telehealth: Payer: Self-pay

## 2023-11-25 ENCOUNTER — Other Ambulatory Visit (HOSPITAL_COMMUNITY): Payer: Self-pay

## 2023-11-25 NOTE — Telephone Encounter (Signed)
Pharmacy Patient Advocate Encounter   Received notification from Pt Calls Messages that prior authorization for Linzess 72 mcg capsule is required/requested.   Insurance verification completed.   The patient is insured through [facility] .   Per test claim: Refill too soon. PA is not needed at this time. Medication was filled 11/19/2023. Next eligible fill date is 12/12/2023.

## 2023-11-25 NOTE — Telephone Encounter (Signed)
No prior auth needed, last filled 11/19/2023 next fill day 12/12/2023

## 2023-12-11 ENCOUNTER — Encounter: Payer: Self-pay | Admitting: Physician Assistant

## 2023-12-11 ENCOUNTER — Ambulatory Visit (INDEPENDENT_AMBULATORY_CARE_PROVIDER_SITE_OTHER): Payer: Self-pay | Admitting: Physician Assistant

## 2023-12-11 ENCOUNTER — Other Ambulatory Visit: Payer: Self-pay

## 2023-12-11 VITALS — BP 108/80 | HR 68 | Temp 96.7°F

## 2023-12-11 DIAGNOSIS — Z4802 Encounter for removal of sutures: Secondary | ICD-10-CM

## 2023-12-11 DIAGNOSIS — S61210A Laceration without foreign body of right index finger without damage to nail, initial encounter: Secondary | ICD-10-CM

## 2023-12-11 NOTE — Progress Notes (Signed)
[facility] [address] [place], [place] [address]   Office Visit Note  Patient Name: Susan Meyers Date of Birth [id number]  Medical Record number [id number]  Date of Service: 12/11/2023  Chief Complaint  Patient presents with   Suture / Staple Removal    Right index finger was closed in a car door about 9-10 days ago     [name] presents to the clinic for suture removal from right index finger. Pt states she had a car door slam on her finger about 9-10 days ago and was seen at [facility] clinic for suture placement. Doing well except slightly tender over the sutured area.   Suture / Staple Removal      Current Medication:  Outpatient Encounter Medications as of 12/11/2023  Medication Sig   acetaminophen (TYLENOL) 500 MG tablet Take 1,000 mg by mouth every 6 (six) hours as needed for moderate pain.    albuterol (VENTOLIN HFA) 108 (90 Base) MCG/ACT inhaler Inhale 2 puffs into the lungs every 6 (six) hours as needed for wheezing or shortness of breath.   azithromycin (ZITHROMAX) 250 MG tablet Take 2 tablets on day 1, then 1 tablet daily on days 2 through 5   benzonatate (TESSALON) 200 MG capsule Take 1 capsule (200 mg total) by mouth 3 (three) times daily as needed for cough.   cetirizine (ZYRTEC) 10 MG tablet Take 10 mg by mouth daily.   estradiol (ESTRACE) 1 MG tablet Take 1 mg by mouth daily.   fluticasone (FLONASE) 50 MCG/ACT nasal spray Place 2 sprays into both nostrils daily.   fluticasone (FLONASE) 50 MCG/ACT nasal spray Place 2 sprays into both nostrils daily.   fluticasone-salmeterol (ADVAIR DISKUS) 500-50 MCG/ACT AEPB Inhale 1 puff into the lungs in the morning and at bedtime.   ibuprofen (ADVIL,MOTRIN) 200 MG tablet Take 400 mg by mouth every 6 (six) hours as needed (For pain.).    ipratropium-albuterol (DUONEB) 0.5-2.5 (3) MG/3ML SOLN Inhale into the lungs as needed.   linaclotide (LINZESS) 72 MCG capsule Take 1 capsule (72 mcg total) by mouth daily before  breakfast. Pharmacy-please d/c rx for motegrity   montelukast (SINGULAIR) 10 MG tablet Take 1 tablet (10 mg total) by mouth at bedtime.   promethazine-dextromethorphan (PROMETHAZINE-DM) 6.25-15 MG/5ML syrup Take 5 mLs by mouth at bedtime as needed for cough.   Sennosides 25 MG TABS Take 1 tablet by mouth daily.   valACYclovir (VALTREX) 500 MG tablet Take 500 mg by mouth daily as needed (For outbreaks.).   estradiol (VIVELLE-DOT) 0.075 MG/24HR 1 patch 2 (two) times a week. (Patient not taking: Reported on 12/11/2023)   No facility-administered encounter medications on file as of 12/11/2023.      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 Negative for color change.    Physical Exam Constitutional:      Appearance: Normal appearance.  HENT:     Head: Normocephalic and atraumatic.     Right Ear: External ear normal.     Left Ear: External ear normal.  Eyes:     Extraocular Movements: Extraocular movements intact.  Skin:    General: Skin is warm and dry.     Capillary Refill: Capillary refill takes less than 2 seconds.     Comments: Right Index Finger: lacerated wound healing well with 4 sutures on the radial aspect of the distal phalanx. No localized erythema. Not active bleeding. Sensation is grossly normal distally. Nail plate in tact. Wound edges well approximated  and healing well. No  discharge.   Neurological:     Mental Status: She is alert and oriented to person, place, and time.  Psychiatric:        Mood and Affect: Mood normal.        Behavior: Behavior normal.       Assessment/Plan:  1. Laceration of right index finger without foreign body without damage to nail, initial encounter (Primary)  2. Visit for suture removal  Removed all (4) sutures. A band-aid applied over the almost healed wound RTC if any difficulty arises. Pt verbalized understanding and in agreement.   General Counseling: Susan Meyers verbalizes understanding of the findings of todays visit and agrees with plan of treatment. I have discussed any further diagnostic evaluation that may be needed or ordered today. We also reviewed her medications today. she has been encouraged to call the office with any questions or concerns that should arise related to todays visit.    Time spent:15 Minutes    Gilberto Better, [place] Physician Assistant

## 2024-01-10 IMAGING — CT CT CHEST HIGH RESOLUTION
2 of 7 series · 14 of 36 positions shown, 17 images · non-contrast
Comparison: No priors.

CLINICAL DATA: 45-year-old female with history of persistent
shortness of breath following COVID infection. Evaluate for
interstitial lung disease.



[Series 4: high resolution · axial · 0.61mm/px · z∈[-296,-28]mm · 11 of 321 slices shown, 14 images]
[im 27/321  mediastinal]
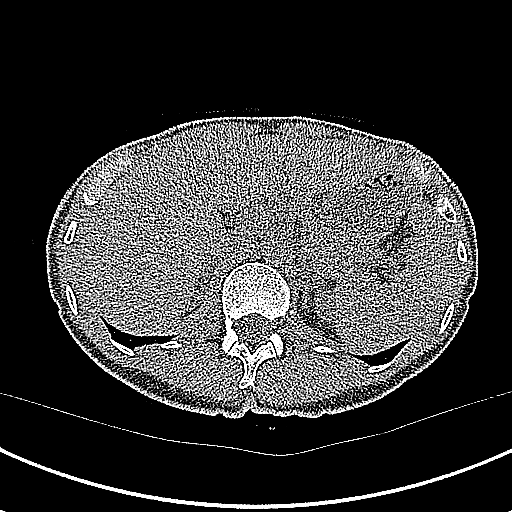
[im 27/321  lung]
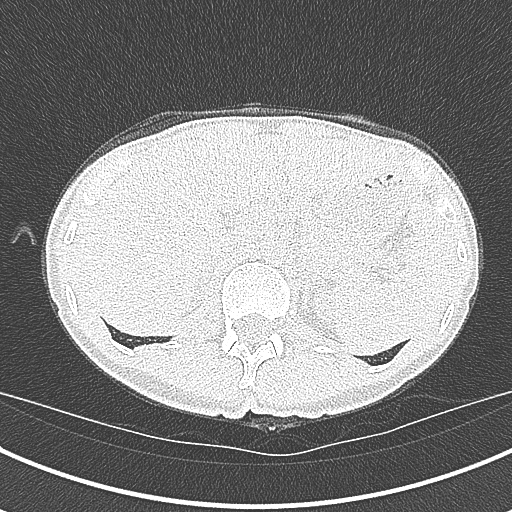
[im 54/321  lung]
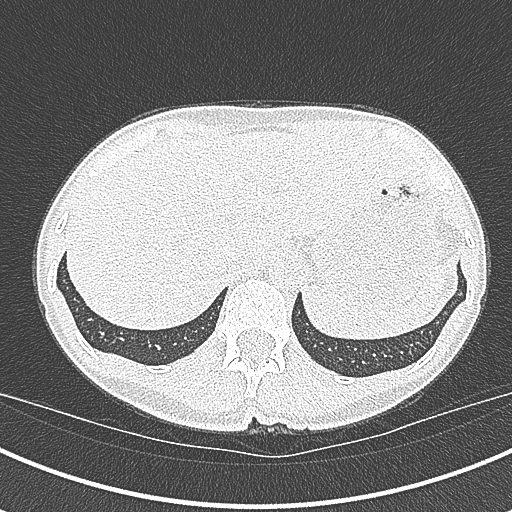
[im 81/321  lung]
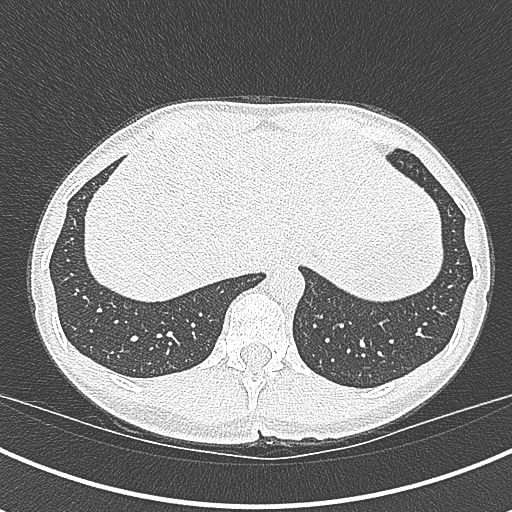
[im 107/321  lung]
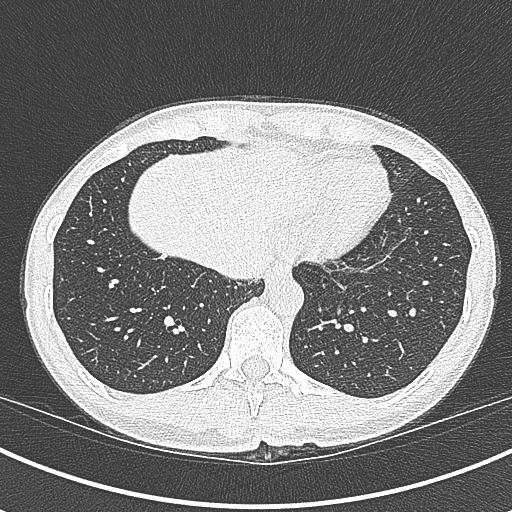
[im 134/321  mediastinal]
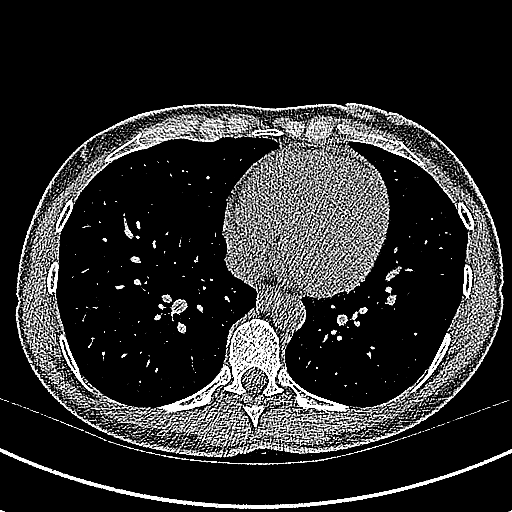
[im 134/321  lung]
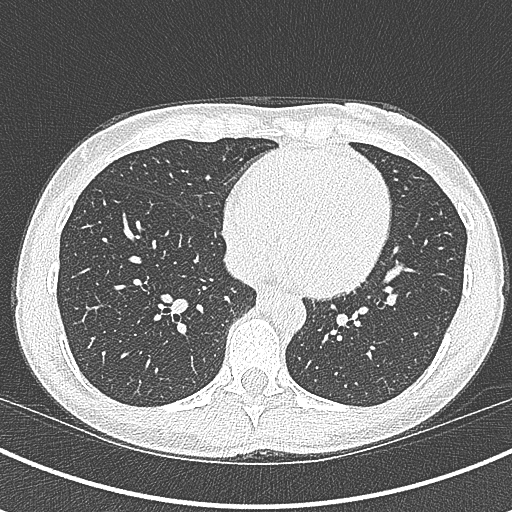
[im 161/321  lung]
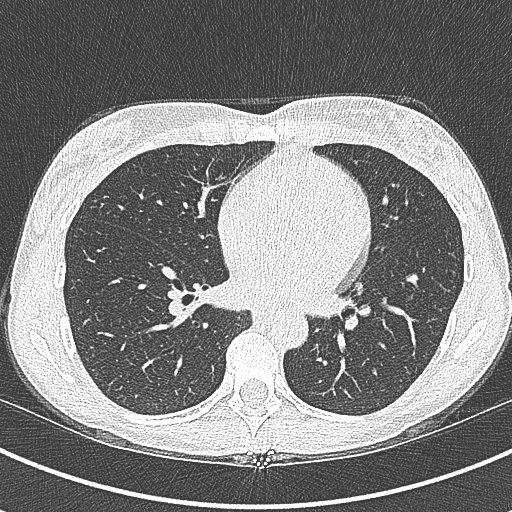
[im 187/321  lung]
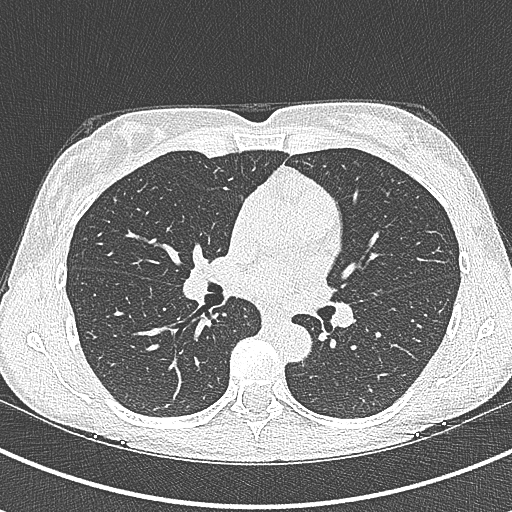
[im 214/321  lung]
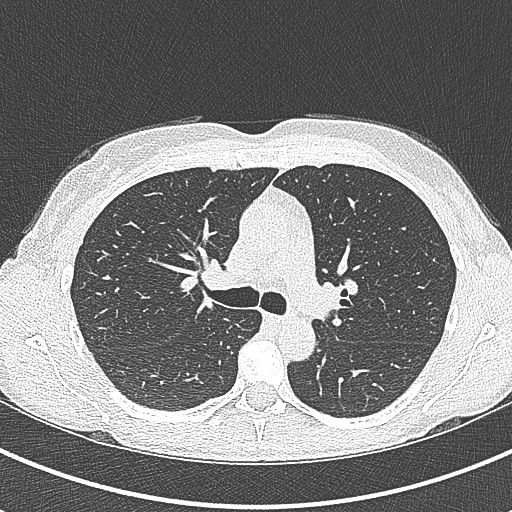
[im 241/321  mediastinal]
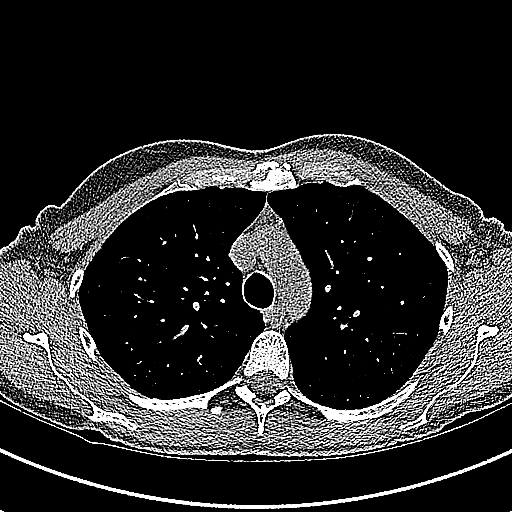
[im 241/321  lung]
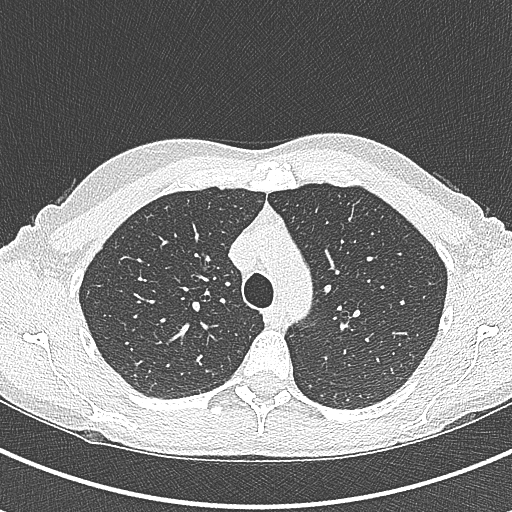
[im 267/321  lung]
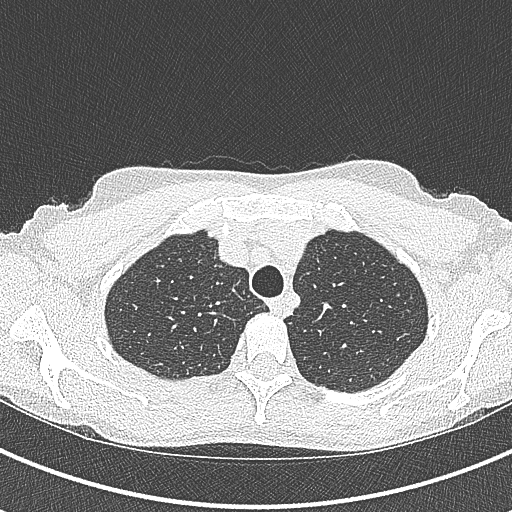
[im 294/321  lung]
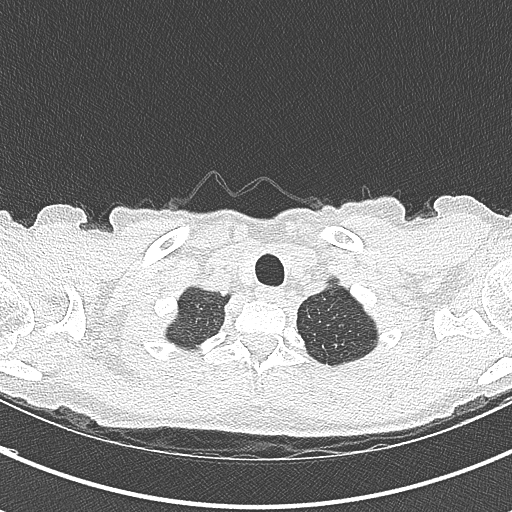

[Series 8: coronal · coronal · 0.59mm/px · 3 of 106 slices shown]
[im 22/106  lung]
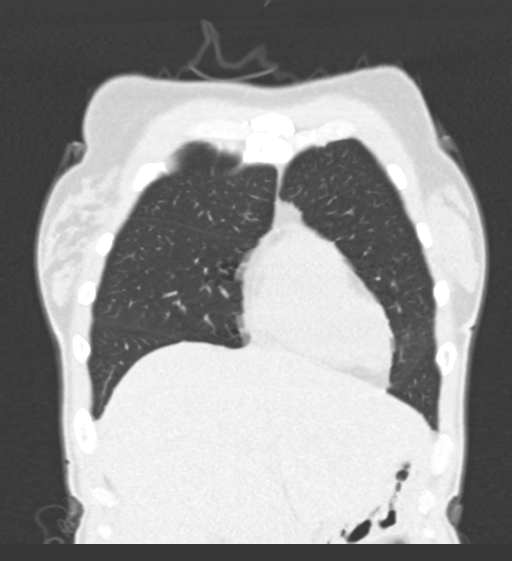
[im 43/106  lung]
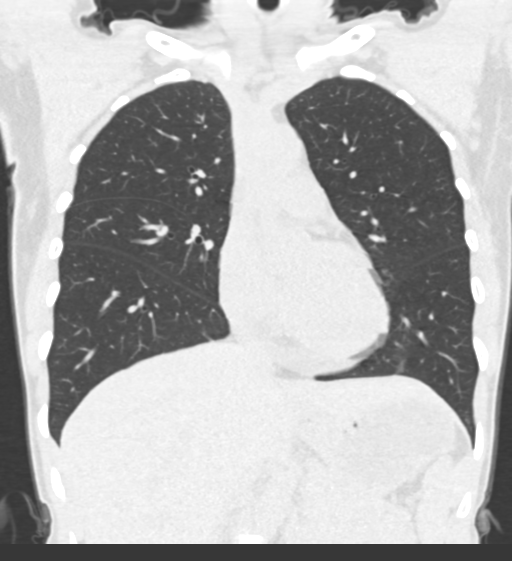
[im 64/106  lung]
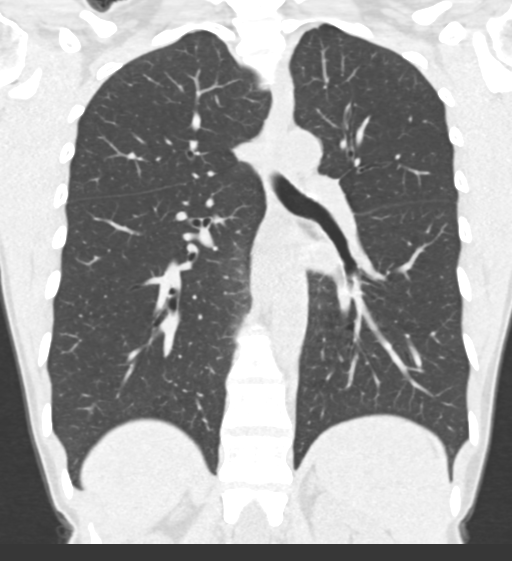

[14 of 36 positions shown; findings below may reference images not displayed]

FINDINGS: Cardiovascular: Heart size is normal. There is no significant
pericardial fluid, thickening or pericardial calcification. No
atherosclerotic calcifications are noted in the thoracic aorta or
the coronary arteries.

Mediastinum/Nodes: No pathologically enlarged mediastinal or hilar
lymph nodes. Please note that accurate exclusion of hilar adenopathy
is limited on noncontrast CT scans. Esophagus is unremarkable in
appearance. No axillary lymphadenopathy.

Lungs/Pleura: High-resolution images demonstrate no significant
regions of ground-glass attenuation, septal thickening, subpleural
reticulation, parenchymal banding, traction bronchiectasis or frank
honeycombing to indicate interstitial lung disease. Inspiratory and
expiratory imaging demonstrates some mild air trapping indicative of
mild small airways disease. No acute consolidative airspace
disease. No pleural effusions. No suspicious appearing pulmonary
nodules or masses are noted.

Upper Abdomen: Unremarkable.

Musculoskeletal: There are no aggressive appearing lytic or blastic
lesions noted in the visualized portions of the skeleton.
IMPRESSION: 1. No findings to suggest interstitial lung disease.
2. Mild air trapping indicative of mild small airways disease.

## 2024-01-21 ENCOUNTER — Other Ambulatory Visit: Payer: Self-pay | Admitting: Gastroenterology

## 2024-01-31 NOTE — Telephone Encounter (Signed)
 As of 01/31/24, Linzess  samples have not been collected by the patient. Therefore, we have returned samples to our sample medication cabinet.

## 2024-03-23 ENCOUNTER — Other Ambulatory Visit: Payer: Self-pay | Admitting: Gastroenterology

## 2024-05-24 ENCOUNTER — Other Ambulatory Visit: Payer: Self-pay | Admitting: Gastroenterology

## 2024-05-25 ENCOUNTER — Telehealth: Payer: Self-pay | Admitting: Gastroenterology

## 2024-05-25 ENCOUNTER — Other Ambulatory Visit: Payer: Self-pay

## 2024-05-25 MED ORDER — LINACLOTIDE 72 MCG PO CAPS: 72.0000 ug | ORAL_CAPSULE | Freq: Every day | ORAL | 0 refills | Status: DC

## 2024-05-25 NOTE — Telephone Encounter (Signed)
 Rx sent for 90 day supply. Pt scheduled 7/30 with Dr Dominic Friendly to discuss colon recall and med check

## 2024-05-25 NOTE — Telephone Encounter (Signed)
 Patient called and stated that she is needing a refill on her medication Linzess . Patient is requesting a larger quantity amount as well. Patient is requesting a call back. Please advise.

## 2024-07-17 ENCOUNTER — Other Ambulatory Visit: Payer: Self-pay | Admitting: Gastroenterology

## 2024-07-22 ENCOUNTER — Ambulatory Visit: Admitting: Gastroenterology

## 2024-07-22 ENCOUNTER — Telehealth: Payer: Self-pay

## 2024-07-22 ENCOUNTER — Encounter: Payer: Self-pay | Admitting: Gastroenterology

## 2024-07-22 ENCOUNTER — Other Ambulatory Visit (HOSPITAL_COMMUNITY): Payer: Self-pay

## 2024-07-22 VITALS — BP 90/60 | HR 80 | Ht 63.5 in | Wt 129.0 lb

## 2024-07-22 DIAGNOSIS — R140 Abdominal distension (gaseous): Secondary | ICD-10-CM | POA: Diagnosis not present

## 2024-07-22 DIAGNOSIS — K5909 Other constipation: Secondary | ICD-10-CM | POA: Diagnosis not present

## 2024-07-22 DIAGNOSIS — Z860100 Personal history of colon polyps, unspecified: Secondary | ICD-10-CM | POA: Diagnosis not present

## 2024-07-22 MED ORDER — LINACLOTIDE 72 MCG PO CAPS: 72.0000 ug | ORAL_CAPSULE | Freq: Every day | ORAL | 0 refills | Status: DC

## 2024-07-22 MED ORDER — NA SULFATE-K SULFATE-MG SULF 17.5-3.13-1.6 GM/177ML PO SOLN: 1.0000 | Freq: Once | ORAL | 0 refills | Status: AC

## 2024-07-22 NOTE — Telephone Encounter (Signed)
*  Gastro  Pharmacy Patient Advocate Encounter   Received notification from CoverMyMeds that prior authorization for Linzess  capsules  is required/requested.   Insurance verification completed.   The patient is insured through [facility] .   Per test claim: PA required; PA submitted to above mentioned insurance via CoverMyMeds Key/confirmation #/EOC [id number] Status is pending

## 2024-07-22 NOTE — Patient Instructions (Addendum)
 We have given you samples of the following medication to take: Linzess  145 mcg and Linzess  72 mcg.  We have sent the following medications to your pharmacy for you to pick up at your convenience: Linzess  72 mcg  You have been scheduled for a colonoscopy. Please follow written instructions given to you at your visit today.   If you use inhalers (even only as needed), please bring them with you on the day of your procedure.  DO NOT TAKE 7 DAYS PRIOR TO TEST- Trulicity (dulaglutide) Ozempic, Wegovy (semaglutide) Mounjaro (tirzepatide) Bydureon Bcise (exanatide extended release)  DO NOT TAKE 1 DAY PRIOR TO YOUR TEST Rybelsus (semaglutide) Adlyxin (lixisenatide) Victoza (liraglutide) Byetta (exanatide) ___________________________________________________________________________  _______________________________________________________  If your blood pressure at your visit was 140/90 or greater, please contact your primary care physician to follow up on this.  _________________________   _______________________________________________________  Food Guidelines for those with chronic digestive trouble:  Many people have difficulty digesting certain foods, causing a variety of distressing and embarrassing symptoms such as abdominal pain, bloating and gas.  These foods may need to be avoided or consumed in small amounts.  Here are some tips that might be helpful for you.  1.   Lactose intolerance is the difficulty or complete inability to digest lactose, the natural sugar in milk and anything made from milk.  This condition is harmless, common, and can begin any time during life.  Some people can digest a modest amount of lactose while others cannot tolerate any.  Also, not all dairy products contain equal amounts of lactose.  For example, hard cheeses such as parmesan have less lactose than soft cheeses such as cheddar.  Yogurt has less lactose than milk or cheese.  Many packaged foods (even  many brands of bread) have milk, so read ingredient lists carefully.  It is difficult to test for lactose intolerance, so just try avoiding lactose as much as possible for a week and see what happens with your symptoms.  If you seem to be lactose intolerant, the best plan is to avoid it (but make sure you get calcium from another source).  The next best thing is to use lactase enzyme supplements, available over the counter everywhere.  Just know that many lactose intolerant people need to take several tablets with each serving of dairy to avoid symptoms.  Lastly, a lot of restaurant food is made with milk or butter.  Many are things you might not suspect, such as mashed potatoes, rice and pasta (cooked with butter) and grilled items.  If you are lactose intolerant, it never hurts to ask your server what has milk or butter.  2.   Fiber is an important part of your diet, but not all fiber is well-tolerated.  Insoluble fiber such as bran is often consumed by normal gut bacteria and converted into gas.  Soluble fiber such as oats, squash, carrots and green beans are typically tolerated better.  3.   Some types of carbohydrates can be poorly digested.  Examples include: fructose (apples, cherries, pears, raisins and other dried fruits), fructans (onions, zucchini, large amounts of wheat), sorbitol/mannitol/xylitol and sucralose/Splenda (common artificial sweeteners), and raffinose (lentils, broccoli, cabbage, asparagus, brussel sprouts, many types of beans).  Do a Programmer, multimedia for [employer] and you will find helpful information. Beano, a dietary supplement, will often help with raffinose-containing foods.  As with lactase tablets, you may need several per serving.  4.   Whenever possible, avoid processed food&meats and chemical additives.  High fructose corn syrup, a common sweetener, may be difficult to digest.  Eggs and soy (comes from the soybean, and added to many foods now) are other common bloating/gassy  foods.  5.  Regarding gluten:  gluten is a protein mainly found in wheat, but also rye and barley.  There is a condition called celiac sprue, which is an inflammatory reaction in the small intestine causing a variety of digestive symptoms.  Blood testing is highly reliable to look for this condition, and sometimes upper endoscopy with small bowel biopsies may be necessary to make the diagnosis.  Many patients who test negative for celiac sprue report improvement in their digestive symptoms when they switch to a gluten-free diet.  However, in these non-celiac gluten sensitive patients, the true role of gluten in their symptoms is unclear.  Reducing carbohydrates in general may decrease the gas and bloating caused when gut bacteria consume carbs. Also, some of these patients may actually be intolerant of the baker's yeast in bread products rather than the gluten.  Flatbread and other reduced yeast breads might therefore be tolerated.  There is no specific testing available for most food intolerances, which are discovered mainly by dietary elimination.  Please do not embark on a gluten free diet unless directed by your doctor, as it is highly restrictive, and may lead to nutritional deficiencies if not carefully monitored.  Lastly, beware of internet claims offering personalized tests for food intolerances.  Such testing has no reliable scientific evidence to support its reliability and correlation to symptoms.    6.  The best advice is old advice, especially for those with chronic digestive trouble - try to eat clean.  Balanced diet, avoid processed food, plenty of fruits and vegetables, cut down the sugar, minimal alcohol, avoid tobacco. Make time to care for yourself, get enough sleep, exercise when you can, reduce stress.  Your guts will thank you for it.   - Dr. Victory Legrand [facility]  ____________________________________________________________      _______________________________________________________  If you are age 53 or older, your body mass index should be between 23-30. Your Body mass index is 22.49 kg/m. If this is out of the aforementioned range listed, please consider follow up with your Primary Care Provider.  If you are age 66 or younger, your body mass index should be between 19-25. Your Body mass index is 22.49 kg/m. If this is out of the aformentioned range listed, please consider follow up with your Primary Care Provider.   ________________________________________________________  The [facility] providers would like to encourage you to use MYCHART to communicate with providers for non-urgent requests or questions.  Due to long hold times on the telephone, sending your provider a message by [facility] may be a faster and more efficient way to get a response.  Please allow 48 business hours for a response.  Please remember that this is for non-urgent requests.  _______________________________________________________  [facility] is using a team-based approach to care.  Your team is made up of your doctor and two to three APPS. Our APPS (Nurse Practitioners and Physician Assistants) work with your physician to ensure care continuity for you. They are fully qualified to address your health concerns and develop a treatment plan. They communicate directly with your gastroenterologist to care for you. Seeing the Advanced Practice Practitioners on your physician's team can help you by facilitating care more promptly, often allowing for earlier appointments, access to diagnostic testing, procedures, and other specialty referrals.

## 2024-07-22 NOTE — Telephone Encounter (Signed)
 Pharmacy Patient Advocate Encounter  Received notification from [employer] that Prior Authorization for Linzess  capsules  has been APPROVED from 07/22/2024 to 07/22/2025. Ran test claim, Copay is $35.00. This test claim was processed through [facility]- copay amounts may vary at other pharmacies due to pharmacy/plan contracts, or as the patient moves through the different stages of their insurance plan.  [employer], the mfr of LINZESS  72 MCG CAPSULE paid 100.00 toward your plan copay

## 2024-07-22 NOTE — Progress Notes (Signed)
 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  Linzess  72 mcg daily along with nightly Senokot tends to work well, though some days she needs to take a second dose of Linzess  if she feels more constipated or bloated.  She sometimes feels the bowel contents are hung up or not passing well. Denies rectal bleeding, appetite is good and weight stable.  ROS: Cardiovascular:  no chest pain Respiratory: no dyspnea  The patient's Past Medical, Family and Social History were reviewed and are on file in the EMR. 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 , Disp: , Rfl:    albuterol  (VENTOLIN  HFA) 108 (90 Base) MCG/ACT inhaler, Inhale 2 puffs into the lungs every 6 (six) hours as needed for wheezing or shortness of breath., Disp: 6.7 g, Rfl: 1    benzonatate  (TESSALON ) 200 MG capsule, Take 1 capsule (200 mg total) by mouth 3 (three) times daily as needed for cough., Disp: 30 capsule, Rfl: 0   cetirizine (ZYRTEC) 10 MG tablet, Take 10 mg by mouth daily., Disp: , Rfl:    estradiol (ESTRACE) 1 MG tablet, Take 1 mg by mouth daily., Disp: , Rfl:    estradiol (VIVELLE-DOT) 0.075 MG/24HR, 1 patch 2 (two) times a week., Disp: , Rfl:    fluticasone  (FLONASE) 50 MCG/ACT nasal spray, Place 2 sprays into both nostrils daily., Disp: , Rfl:    ibuprofen (ADVIL,MOTRIN) 200 MG tablet, Take 400 mg by mouth every 6 (six) hours as needed (For pain.). , Disp: , Rfl:    ipratropium-albuterol  (DUONEB) 0.5-2.5 (3) MG/3ML SOLN, Inhale into the lungs as needed., Disp: , Rfl:    linaclotide  (LINZESS ) 72 MCG capsule, Take 1 capsule (72 mcg total) by mouth daily before breakfast., Disp: 90 capsule, Rfl: 0   montelukast  (SINGULAIR ) 10 MG tablet, Take 1 tablet (10 mg total) by mouth at bedtime., Disp: 30 tablet, Rfl: 3   promethazine -dextromethorphan (PROMETHAZINE -DM) 6.25-15 MG/5ML syrup, Take 5 mLs by mouth at bedtime as needed for cough., Disp: 118 mL, Rfl: 0   Sennosides 25 MG TABS, Take 1 tablet by mouth daily., Disp: , Rfl:    SYMBICORT  160-4.5 MCG/ACT inhaler, Inhale 2 puffs into the lungs 2 (two) times daily.,  Disp: , Rfl:    valACYclovir  (VALTREX ) 500 MG tablet, Take 500 mg by mouth daily as needed (For outbreaks.)., Disp: , Rfl: 0   fluticasone -salmeterol (ADVAIR DISKUS) 500-50 MCG/ACT AEPB, Inhale 1 puff into the lungs in the morning and at bedtime. (Patient not taking: Reported on 07/22/2024), Disp: 1 each, Rfl: 1   Vital signs in last 24 hrs: Vitals:   07/22/24 0904  BP: 90/60  Pulse: 80   Wt Readings from Last 3 Encounters:  07/22/24 129 lb (58.5 kg)  11/19/23 130 lb (59 kg)  05/01/23 133 lb (60.3 kg)    Physical Exam  Well-appearing  Cardiac: Regular without appreciable murmur,  no peripheral edema Pulm: clear to auscultation bilaterally,  normal RR and effort noted Abdomen: soft, mild left-sided tenderness, with active bowel sounds. No guarding or palpable hepatosplenomegaly.  No distention, normal bowel sounds Skin; warm and dry, no jaundice or rash  Labs:   ___________________________________________ Radiologic studies:   ____________________________________________ Other:   _____________________________________________ Assessment & Plan  Assessment: Encounter Diagnoses  Name Primary?   Chronic constipation Yes   Abdominal bloating    Hx of colonic polyps    Chronic constipation with bloating, generally good on Linzess  72 mcg daily.  Some fluctuation in symptoms where she needs an additional dose some days, so we gave her samples of both 72 and 145 mcg to take when needed so she will not run out of her monthly supply early. Some dietary advice regarding common triggers of gas and bloating were imported into her AVS today.  Due for screening colonoscopy with fair prep on the 2022 exam.  Will use Suprep and close attention to preprocedure dietary restrictions regarding fibrous foods.  Consume more water with prep.   Victory LITTIE Brand III

## 2024-07-25 ENCOUNTER — Encounter: Payer: Self-pay | Admitting: Gastroenterology

## 2024-08-25 ENCOUNTER — Encounter: Payer: Self-pay | Admitting: Gastroenterology

## 2024-08-28 ENCOUNTER — Telehealth: Payer: Self-pay | Admitting: Gastroenterology

## 2024-08-28 MED ORDER — NA SULFATE-K SULFATE-MG SULF 17.5-3.13-1.6 GM/177ML PO SOLN: 1.0000 | Freq: Once | ORAL | 0 refills | Status: AC

## 2024-08-28 NOTE — Telephone Encounter (Signed)
 Called and spoke with pt. She reports she is unsure what happened but she has lost her colonoscopy prep. She is requesting a new Rx be sent to the [facility] on file. Medication sent. Informed pt that she could look on GoodRx or Single care for a coupon. Verbalized understanding.

## 2024-08-28 NOTE — Telephone Encounter (Signed)
 Inbound call from pt stating that she has lost her prep medication and has looked every where for it. Patient is requesting that we send her pharmacy a new prep medication. Patient is also requesting a call back. Patient is schedule for a colonoscopy on September the 8 th. Please advise.

## 2024-08-31 ENCOUNTER — Encounter: Payer: Self-pay | Admitting: Gastroenterology

## 2024-08-31 ENCOUNTER — Ambulatory Visit: Admitting: Gastroenterology

## 2024-08-31 VITALS — BP 117/82 | HR 62 | Temp 97.2°F | Resp 11 | Ht 63.0 in | Wt 129.0 lb

## 2024-08-31 DIAGNOSIS — Z860100 Personal history of colon polyps, unspecified: Secondary | ICD-10-CM

## 2024-08-31 DIAGNOSIS — K6389 Other specified diseases of intestine: Secondary | ICD-10-CM

## 2024-08-31 DIAGNOSIS — Z1211 Encounter for screening for malignant neoplasm of colon: Secondary | ICD-10-CM

## 2024-08-31 DIAGNOSIS — Z980 Intestinal bypass and anastomosis status: Secondary | ICD-10-CM | POA: Diagnosis not present

## 2024-08-31 DIAGNOSIS — K5909 Other constipation: Secondary | ICD-10-CM

## 2024-08-31 DIAGNOSIS — R140 Abdominal distension (gaseous): Secondary | ICD-10-CM

## 2024-08-31 MED ORDER — SODIUM CHLORIDE 0.9 % IV SOLN: 500.0000 mL | INTRAVENOUS | Status: DC

## 2024-08-31 NOTE — Progress Notes (Signed)
 Pt's states no medical or surgical changes since previsit or office visit.

## 2024-08-31 NOTE — Patient Instructions (Signed)
YOU HAD AN ENDOSCOPIC PROCEDURE TODAY AT [facility]:   Refer to the procedure report that was given to you for any specific questions about what was found during the examination.  If the procedure report does not answer your questions, please call your gastroenterologist to clarify.  If you requested that your care partner not be given the details of your procedure findings, then the procedure report has been included in a sealed envelope for you to review at your convenience later.  YOU SHOULD EXPECT: Some feelings of bloating in the abdomen. Passage of more gas than usual.  Walking can help get rid of the air that was put into your GI tract during the procedure and reduce the bloating. If you had a lower endoscopy (such as a colonoscopy or flexible sigmoidoscopy) you may notice spotting of blood in your stool or on the toilet paper. If you underwent a bowel prep for your procedure, you may not have a normal bowel movement for a few days.  Please Note:  You might notice some irritation and congestion in your nose or some drainage.  This is from the oxygen used during your procedure.  There is no need for concern and it should clear up in a day or so.  SYMPTOMS TO REPORT IMMEDIATELY:  Following lower endoscopy (colonoscopy or flexible sigmoidoscopy):  Excessive amounts of blood in the stool  Significant tenderness or worsening of abdominal pains  Swelling of the abdomen that is new, acute  Fever of 100F or higher   For urgent or emergent issues, a gastroenterologist can be reached at any hour by calling [phone]. Do not use MyChart messaging for urgent concerns.    DIET:  We do recommend a small meal at first, but then you may proceed to your regular diet.  Drink plenty of fluids but you should avoid alcoholic beverages for 24 hours.  MEDICATIONS: Continue present medications.  FOLLOW UP: Repeat colonoscopy in 10 years for screening purposes.  Thank you for allowing  [place] to provide for your healthcare needs today.  ACTIVITY:  You should plan to take it easy for the rest of today and you should NOT DRIVE or use heavy machinery until tomorrow (because of the sedation medicines used during the test).    FOLLOW UP: Our staff will call the number listed on your records the next business day following your procedure.  We will call around 7:15- 8:00 am to check on you and address any questions or concerns that you may have regarding the information given to you following your procedure. If we do not reach you, we will leave a message.     If any biopsies were taken you will be contacted by phone or by letter within the next 1-3 weeks.  Please call us at [phone] if you have not heard about the biopsies in 3 weeks.    SIGNATURES/CONFIDENTIALITY: You and/or your care partner have signed paperwork which will be entered into your electronic medical record.  These signatures attest to the fact that that the information above on your After Visit Summary has been reviewed and is understood.  Full responsibility of the confidentiality of this discharge information lies with you and/or your care-partner.

## 2024-08-31 NOTE — Progress Notes (Signed)
 Vss nad trans to pacu

## 2024-08-31 NOTE — Progress Notes (Signed)
 History and Physical:  This patient presents for endoscopic testing for: Encounter Diagnoses  Name Primary?   Chronic constipation Yes   Abdominal bloating    Hx of colonic polyps     48 year old woman known to me and last seen in the office 07/22/2024, here today for screening colonoscopy.  She has chronic constipation and bloating.  She had a screening colonoscopy with me in 2022 with no polyps discovered, but the quality of preparation was fair with MiraLAX .  She is here today for a screening colonoscopy at short interval because of that previously fair prep.  Patient is otherwise without complaints or active issues today.   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   Overview:  Full thickness; for rectoplexy in future due to cost/cla 05/2013    Rectal prolapse    Seasonal allergies      Past Surgical History: Past Surgical History:  Procedure Laterality Date   ABDOMINAL HYSTERECTOMY  2021   COLONOSCOPY     POLYPECTOMY     PROCTOSCOPY N/A 06/06/2016   Procedure: RIGID PROCTOSCOPY;  Surgeon: Elspeth Schultze, MD;  Location: [facility];  Service: General;  Laterality: N/A;   SIGMOIDOSCOPY      Allergies: Allergies  Allergen Reactions   Motegrity  [Prucalopride] Nausea And Vomiting    Dizziness, headache    Outpatient Meds: Current Outpatient Medications  Medication Sig Dispense Refill   acetaminophen  (TYLENOL ) 500 MG tablet Take 1,000 mg by mouth every 6 (six) hours as needed for moderate pain.      albuterol  (VENTOLIN  HFA) 108 (90 Base) MCG/ACT inhaler Inhale 2 puffs into the lungs every 6 (six) hours as needed for wheezing or shortness of breath. 6.7 g 1   cetirizine  (ZYRTEC) 10 MG tablet Take 10 mg by mouth daily.     estradiol (VIVELLE-DOT) 0.075 MG/24HR 1 patch 2 (two) times a week.     fluticasone  (FLONASE) 50 MCG/ACT nasal spray Place 2 sprays into both nostrils daily.     fluticasone -salmeterol (ADVAIR DISKUS) 500-50 MCG/ACT AEPB Inhale 1 puff into the lungs in the morning and at bedtime. 1 each 1   linaclotide  (LINZESS ) 72 MCG capsule Take 1 capsule (72 mcg total) by mouth daily before breakfast. 90 capsule 0   Sennosides 25 MG TABS Take 1 tablet by mouth daily.     SYMBICORT  160-4.5 MCG/ACT inhaler Inhale 2 puffs into the lungs 2 (two) times daily.     benzonatate  (TESSALON ) 200 MG capsule Take 1 capsule (200 mg total) by mouth 3 (three) times daily as needed for cough. 30 capsule 0   estradiol (ESTRACE) 1 MG tablet Take 1 mg by mouth daily.     ibuprofen (ADVIL,MOTRIN) 200 MG tablet Take 400 mg by mouth every 6 (six) hours as needed (For pain.).      ipratropium-albuterol  (DUONEB) 0.5-2.5 (3) MG/3ML SOLN Inhale into the lungs as needed.     montelukast  (SINGULAIR ) 10 MG tablet Take 1 tablet (10 mg total) by mouth at bedtime. 30 tablet 3   promethazine -dextromethorphan (PROMETHAZINE -DM) 6.25-15 MG/5ML syrup Take 5 mLs by mouth at bedtime as needed for  cough. 118 mL 0   valACYclovir  (VALTREX ) 500 MG tablet Take 500 mg by mouth daily as needed (For outbreaks.).  0   Current Facility-Administered Medications  Medication Dose Route Frequency Provider Last Rate Last Admin   0.9 %  sodium chloride  infusion  500 mL Intravenous Continuous Danis, Victory CROME III, MD          ___________________________________________________________________ Objective   Exam:  BP (!) 117/92   Pulse 62   Temp (!) 97.2 F (36.2 C)   Ht 5' 3 (1.6 m)   Wt 129 lb (58.5 kg)   LMP 03/07/2016 (Approximate) Comment: IUD  SpO2 98%   BMI 22.85 kg/m   CV: regular , S1/S2 Resp: clear to auscultation bilaterally, normal RR and effort noted GI: soft, no tenderness, with  active bowel sounds.   Assessment: Encounter Diagnoses  Name Primary?   Chronic constipation Yes   Abdominal bloating    Hx of colonic polyps      Plan: Colonoscopy   The benefits and risks of the planned procedure(s) were described in detail with the patient or (when appropriate) their health care proxy.  Risks were outlined as including, but not limited to, bleeding, infection, perforation, adverse medication reaction leading to cardiac or pulmonary decompensation, pancreatitis (if ERCP).  The limitation of incomplete mucosal visualization was also discussed.  No guarantees or warranties were given.  The patient is appropriate for an endoscopic procedure in the ambulatory setting.   - Victory Brand, MD

## 2024-08-31 NOTE — Op Note (Signed)
 [facility] Patient Name: Susan Meyers Procedure Date: 08/31/2024 2:46 PM MRN: [id number] Endoscopist: Victory L. Legrand , MD, [id number] Age: 48 Referring MD:  Date of Birth: 01-09-1976 Gender: Female Account #: [id number] Procedure:                Colonoscopy Indications:              Screening for colorectal malignant neoplasm                           fair prep on 2022 screening colonoscopy (miralax                             prep) -no polyps on that exam                           incidental constipation noted                           Prior partial sigmoid resection and rectopexy for                            rectal prolapse Medicines:                Monitored Anesthesia Care Procedure:                Pre-Anesthesia Assessment:                           - Prior to the procedure, a History and Physical                            was performed, and patient medications and                            allergies were reviewed. The patient's tolerance of                            previous anesthesia was also reviewed. The risks                            and benefits of the procedure and the sedation                            options and risks were discussed with the patient.                            All questions were answered, and informed consent                            was obtained. Prior Anticoagulants: The patient has                            taken no anticoagulant or antiplatelet agents. ASA  Grade Assessment: II - A patient with mild systemic                            disease. After reviewing the risks and benefits,                            the patient was deemed in satisfactory condition to                            undergo the procedure.                           After obtaining informed consent, the colonoscope                            was passed under direct vision. Throughout the                            procedure, the  patient's blood pressure, pulse, and                            oxygen saturations were monitored continuously. The                            CF [id number] #[id number] was introduced through the anus                            and advanced to the the cecum, identified by                            appendiceal orifice and ileocecal valve. The                            colonoscopy was somewhat difficult due to a                            redundant colon. Successful completion of the                            procedure was aided by straightening and shortening                            the scope to obtain bowel loop reduction. The                            patient tolerated the procedure well. The quality                            of the bowel preparation was good. The ileocecal                            valve, appendiceal orifice, and rectum were  photographed. The bowel preparation used was SUPREP. Scope In: 2:56:59 PM Scope Out: 3:13:44 PM Scope Withdrawal Time: 0 hours 12 minutes 15 seconds  Total Procedure Duration: 0 hours 16 minutes 45 seconds  Findings:                 The perianal and digital rectal examinations were                            normal.                           An area of melanosis was found in the entire colon.                           The colon (entire examined portion) was redundant.                           Repeat examination of right colon under NBI                            performed.                           There was evidence of a prior end-to-end                            colo-rectal anastomosis in the mid rectum. Mild to                            moderate luminal narrowing at the anastomosis,                            scope passes easily.                           Retroflexion in the rectum was not performed due to                            anatomy.                           The exam was otherwise without  abnormality. Complications:            No immediate complications. Estimated Blood Loss:     Estimated blood loss: none. Impression:               - Melanosis in the colon.                           - Redundant colon.                           - End-to-end colo-rectal anastomosis.                           - The examination was otherwise normal.                           -  No specimens collected. Recommendation:           - Patient has a contact number available for                            emergencies. The signs and symptoms of potential                            delayed complications were discussed with the                            patient. Return to normal activities tomorrow.                            Written discharge instructions were provided to the                            patient.                           - Resume previous diet.                           - Continue present medications.                           - Repeat colonoscopy in 10 years for screening                            purposes. Nell Gales L. Legrand, MD 08/31/2024 3:20:54 PM This report has been signed electronically.

## 2024-09-01 ENCOUNTER — Telehealth: Payer: Self-pay

## 2024-09-01 NOTE — Telephone Encounter (Signed)
  Follow up Call-     08/31/2024    2:06 PM  Call back number  Post procedure Call Back phone  # [phone]  Permission to leave phone message Yes     Patient questions:  Do you have a fever, pain , or abdominal swelling? No. Pain Score  0 *  Have you tolerated food without any problems? Yes.    Have you been able to return to your normal activities? Yes.    Do you have any questions about your discharge instructions: Diet   No. Medications  No. Follow up visit  No.  Do you have questions or concerns about your Care? No.  Actions: * If pain score is 4 or above: No action needed, pain <4.

## 2024-10-06 ENCOUNTER — Other Ambulatory Visit: Payer: Self-pay

## 2024-10-06 ENCOUNTER — Ambulatory Visit: Payer: Self-pay | Admitting: Medical

## 2024-10-06 ENCOUNTER — Encounter: Payer: Self-pay | Admitting: Medical

## 2024-10-06 VITALS — BP 100/80 | HR 82 | Temp 97.1°F | Ht 63.0 in | Wt 125.0 lb

## 2024-10-06 DIAGNOSIS — R42 Dizziness and giddiness: Secondary | ICD-10-CM

## 2024-10-06 DIAGNOSIS — J3489 Other specified disorders of nose and nasal sinuses: Secondary | ICD-10-CM

## 2024-10-06 DIAGNOSIS — J069 Acute upper respiratory infection, unspecified: Secondary | ICD-10-CM

## 2024-10-06 NOTE — Progress Notes (Signed)
 [facility] [address] [place], [place] [address]   Office Visit Note  Patient Name: Susan Meyers Date of Birth [id number]  Medical Record number [id number]  Date of Service: 10/06/2024  Chief Complaint  Patient presents with   Sick visit    Patient c/o dizziness and feeling off balance. She states she has not felt well for 2 weeks. She had a telehealth visit four days ago and was prescribed Augmentin. She is feeling slightly better but states she still feels congested and has ringing in her ears. She has tested negative for COVID twice. She returned to work today.      HPI 48 y.o. female presents with respiratory symptoms and lightheadedness.  Sick with cold sx beginning 2 weeks ago. Sinus pressure bothersome. Temp has been elevated off and on, 99.6 or 99.8 when checked. No unusual sweats. Some chills at times. Clear nasal d/c, expectorates some yellow mucus. Frontal HA off and on, though not today. Coughing in fits here and there. A little SOB but not unusual for her asthma/allergies. Took Albuterol  this AM as usual, not otherwise. Also on Symbicort  twice daily.  Had telehealth appt 3 days ago. Was prescribed Augmentin BID x 10 days for bronchitis versus sinusitis, ~3-4 days into this prescription. Sore throat has improved but feels frustrated that not much else has.   Some hx of bronchitis, not sinus infections. Former smoker, quit >20 years ago.  Feels lightheaded/off balance and mentally foggy, no vertigo, unsure if this began before or after starting abx. Has ear pressure and ringing. Has been fatigued throughout. Has missed work several days here and there over last 7-10 days. Appetite some what decreased but still eating/drinking regularly.  Has been using Benzonatate  when needed. Also Azelastine nasal spray given by Telehealth. Does use Flonase daily.  No decongestant so far.  Took at-home COVID-19 antigen tests twice, both negative.  Current  Medication:  Outpatient Encounter Medications as of 10/06/2024  Medication Sig   acetaminophen  (TYLENOL ) 500 MG tablet Take 1,000 mg by mouth every 6 (six) hours as needed for moderate pain.    albuterol  (VENTOLIN  HFA) 108 (90 Base) MCG/ACT inhaler Inhale 2 puffs into the lungs every 6 (six) hours as needed for wheezing or shortness of breath.   amoxicillin-clavulanate (AUGMENTIN) 875-125 MG tablet Take 1 tablet by mouth 2 (two) times daily.   benzonatate  (TESSALON ) 200 MG capsule Take 1 capsule (200 mg total) by mouth 3 (three) times daily as needed for cough.   cetirizine (ZYRTEC) 10 MG tablet Take 10 mg by mouth daily.   estradiol (ESTRACE) 1 MG tablet Take 1 mg by mouth daily.   fluticasone  (FLONASE) 50 MCG/ACT nasal spray Place 2 sprays into both nostrils daily.   ibuprofen (ADVIL,MOTRIN) 200 MG tablet Take 400 mg by mouth every 6 (six) hours as needed (For pain.).    ipratropium-albuterol  (DUONEB) 0.5-2.5 (3) MG/3ML SOLN Inhale into the lungs as needed.   linaclotide  (LINZESS ) 72 MCG capsule Take 1 capsule (72 mcg total) by mouth daily before breakfast.   Sennosides 25 MG TABS Take 1 tablet by mouth daily.   SYMBICORT  160-4.5 MCG/ACT inhaler Inhale 2 puffs into the lungs 2 (two) times daily.   valACYclovir  (VALTREX ) 500 MG tablet Take 500 mg by mouth daily as needed (For outbreaks.).   [DISCONTINUED] promethazine -dextromethorphan (PROMETHAZINE -DM) 6.25-15 MG/5ML syrup Take 5 mLs by mouth at bedtime as needed for cough.   estradiol (VIVELLE-DOT) 0.075 MG/24HR 1 patch 2 (two) times a  week. (Patient not taking: Reported on 10/06/2024)   montelukast  (SINGULAIR ) 10 MG tablet Take 1 tablet (10 mg total) by mouth at bedtime. (Patient not taking: Reported on 10/06/2024)   [DISCONTINUED] fluticasone -salmeterol (ADVAIR DISKUS) 500-50 MCG/ACT AEPB Inhale 1 puff into the lungs in the morning and at bedtime.   No facility-administered encounter medications on file as of 10/06/2024.      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 Negative for fever.  HENT:  Positive for congestion, sinus pressure, sore throat (now resolved) and tinnitus. Negative for ear pain, rhinorrhea and trouble swallowing.        Ear pressure bilaterally  Eyes:  Negative for visual disturbance.  Respiratory:  Positive for cough and shortness of breath (not more than usual).   Cardiovascular:  Negative for chest pain.  Gastrointestinal:  Positive for vomiting (once near onset of sx over a week ago). Negative for diarrhea and nausea.  Musculoskeletal:  Negative for myalgias and neck stiffness.  Neurological:  Positive for dizziness, light-headedness and headaches (intermittent).    Physical Exam Vitals reviewed.  Constitutional:      General: She is not in acute distress.    Appearance: She is not ill-appearing.  HENT:     Head: Normocephalic and atraumatic.     Right Ear: Ear canal and external ear normal. Tympanic membrane is not injected, erythematous or bulging.     Left Ear: Ear canal and external ear normal. Tympanic membrane is not injected, erythematous or bulging.     Ears:     Comments: TMs dull bilaterally, perhaps small mucoserous middle ear fluid.    Nose: Congestion  present. No mucosal edema or rhinorrhea.     Right Turbinates: Swollen (mild).     Left Turbinates: Swollen (mild).     Comments: Pressure/slight tenderness with palpation over frontal and maxillary sinuses.    Mouth/Throat:     Mouth: Mucous membranes are moist. No oral lesions.     Pharynx: No pharyngeal swelling or posterior oropharyngeal erythema.     Tonsils: No tonsillar exudate. 0 on the right. 0 on the left.  Cardiovascular:     Rate and Rhythm: Normal rate and regular rhythm.     Heart sounds: No murmur heard.    No friction rub. No gallop.  Pulmonary:     Effort: Pulmonary effort is normal. No prolonged expiration or respiratory distress.     Breath sounds: Normal breath sounds. No decreased air movement. No wheezing, rhonchi or rales.     Comments: Patient felt briefly lightheaded after multiple deep breaths. Musculoskeletal:  Cervical back: Neck supple. No rigidity.  Lymphadenopathy:     Cervical: Cervical adenopathy (small anterior and posterior nodes, slightly tender) present.  Neurological:     Mental Status: She is alert.     Gait: Gait is intact.     Assessment/Plan: 1. Acute upper respiratory infection (Primary) 2. Lightheadedness 3. Sinus pressure Lightheadedness perhaps due to sinus pressure/congestion. Discussed trial of OTC Sudafed/Pseudoephedrine. Symptoms could also be medication side effect. Continue antibiotic for now. Will check CBC with diff and BMP to better characterize infection and assess for other possible causes for sx. Continue inhalers/nasal sprays as prescribed. Continue supportive measures. Will follow up with lab results when available.  - CBC with Differential/Platelet - Basic metabolic panel with GFR  Patient Instructions  -Continue taking the antibiotic. Make sure to take it with food. -Continue your inhalers as prescribed. Continue your nasal sprays (particularly Flonase) as prescribed. -Rest and stay well hydrated (by drinking water  and other liquids). Limit caffeine and avoid alcohol. -Take over-the-counter medicines (i.e. Dayquil, Nyquil, Ibuprofen) to help relieve your symptoms. -For your cough, you may use cough drops and/or drink warm liquids (like tea with honey). -You will receive a MyChart message notifying you of your lab results when they are available.  -Send MyChart message to provider in meantime as needed for new/worsening symptoms (i.e. fever, increased shortness of breath/chest tightness or pain).     General Counseling: Jazmeen verbalizes understanding of the findings of todays visit and plan of treatment. she has been encouraged to call the office with any questions or concerns that should arise related to todays visit.    Time spent:20 Minutes    Joen Arts PA-C Physician Assistant

## 2024-10-06 NOTE — Patient Instructions (Addendum)
-  Continue taking the antibiotic. Make sure to take it with food. -Continue your inhalers as prescribed. Continue your nasal sprays (particularly Flonase) as prescribed. -Rest and stay well hydrated (by drinking water and other liquids). Limit caffeine and avoid alcohol. -Take over-the-counter medicines (i.e. Dayquil, Nyquil, Ibuprofen) to help relieve your symptoms. -For your cough, you may use cough drops and/or drink warm liquids (like tea with honey). -You will receive a MyChart message notifying you of your lab results when they are available.  -Send MyChart message to provider in meantime as needed for new/worsening symptoms (i.e. fever, increased shortness of breath/chest tightness or pain).

## 2024-10-07 DIAGNOSIS — J45901 Unspecified asthma with (acute) exacerbation: Secondary | ICD-10-CM

## 2024-10-07 DIAGNOSIS — J069 Acute upper respiratory infection, unspecified: Secondary | ICD-10-CM

## 2024-10-07 LAB — CBC WITH DIFFERENTIAL/PLATELET
Basophils Absolute: 0 x10E3/uL (ref 0.0–0.2)
Basos: 1 %
EOS (ABSOLUTE): 0.1 x10E3/uL (ref 0.0–0.4)
Eos: 1 %
Hematocrit: 41.3 % (ref 34.0–46.6)
Hemoglobin: 13.8 g/dL (ref 11.1–15.9)
Immature Grans (Abs): 0 x10E3/uL (ref 0.0–0.1)
Immature Granulocytes: 0 %
Lymphocytes Absolute: 1.5 x10E3/uL (ref 0.7–3.1)
Lymphs: 24 %
MCH: 31.4 pg (ref 26.6–33.0)
MCHC: 33.4 g/dL (ref 31.5–35.7)
MCV: 94 fL (ref 79–97)
Monocytes Absolute: 0.3 x10E3/uL (ref 0.1–0.9)
Monocytes: 5 %
Neutrophils Absolute: 4.3 x10E3/uL (ref 1.4–7.0)
Neutrophils: 69 %
Platelets: 331 x10E3/uL (ref 150–450)
RBC: 4.4 x10E6/uL (ref 3.77–5.28)
RDW: 12.7 % (ref 11.7–15.4)
WBC: 6.1 x10E3/uL (ref 3.4–10.8)

## 2024-10-07 LAB — BASIC METABOLIC PANEL WITH GFR
BUN/Creatinine Ratio: 16 (ref 9–23)
BUN: 13 mg/dL (ref 6–24)
CO2: 21 mmol/L (ref 20–29)
Calcium: 10 mg/dL (ref 8.7–10.2)
Chloride: 102 mmol/L (ref 96–106)
Creatinine, Ser: 0.8 mg/dL (ref 0.57–1.00)
Glucose: 127 mg/dL — ABNORMAL HIGH (ref 70–99)
Potassium: 4 mmol/L (ref 3.5–5.2)
Sodium: 137 mmol/L (ref 134–144)
eGFR: 90 mL/min/1.73 (ref >59)

## 2024-10-13 MED ORDER — PREDNISONE 20 MG PO TABS: 40.0000 mg | ORAL_TABLET | Freq: Every day | ORAL | 0 refills | Status: AC

## 2025-01-21 ENCOUNTER — Other Ambulatory Visit: Payer: Self-pay | Admitting: Gastroenterology
